# Patient Record
Sex: Male | Born: 1975 | Race: White | Hispanic: No | Marital: Married | Smoking: Former smoker
Health system: Southern US, Academic
[De-identification: ages and names within clinical notes are randomized; demographics above are authoritative.]

## PROBLEM LIST (undated history)

## (undated) DIAGNOSIS — Z9989 Dependence on other enabling machines and devices: Secondary | ICD-10-CM

## (undated) DIAGNOSIS — K610 Anal abscess: Secondary | ICD-10-CM

## (undated) DIAGNOSIS — F429 Obsessive-compulsive disorder, unspecified: Secondary | ICD-10-CM

## (undated) DIAGNOSIS — E785 Hyperlipidemia, unspecified: Secondary | ICD-10-CM

## (undated) DIAGNOSIS — G4730 Sleep apnea, unspecified: Secondary | ICD-10-CM

## (undated) DIAGNOSIS — E119 Type 2 diabetes mellitus without complications: Secondary | ICD-10-CM

## (undated) DIAGNOSIS — S98119A Complete traumatic amputation of unspecified great toe, initial encounter: Secondary | ICD-10-CM

## (undated) DIAGNOSIS — I7090 Unspecified atherosclerosis: Secondary | ICD-10-CM

## (undated) DIAGNOSIS — I10 Essential (primary) hypertension: Secondary | ICD-10-CM

## (undated) DIAGNOSIS — K922 Gastrointestinal hemorrhage, unspecified: Secondary | ICD-10-CM

## (undated) DIAGNOSIS — M92529 Juvenile osteochondrosis of tibia tubercle, unspecified leg: Secondary | ICD-10-CM

## (undated) DIAGNOSIS — N4404 Torsion of appendix epididymis: Secondary | ICD-10-CM

## (undated) HISTORY — PX: HX APPENDECTOMY: SHX54

## (undated) HISTORY — DX: Obsessive-compulsive disorder, unspecified: F42.9

## (undated) HISTORY — PX: SURGERY SCROTAL / TESTICULAR: SUR1316

## (undated) HISTORY — DX: Unspecified atherosclerosis: I70.90

## (undated) HISTORY — DX: Essential (primary) hypertension: I10

## (undated) HISTORY — PX: TOE AMPUTATION: SHX809

## (undated) HISTORY — PX: COLONOSCOPY: SHX174

## (undated) HISTORY — DX: Hyperlipidemia, unspecified: E78.5

## (undated) HISTORY — DX: Type 2 diabetes mellitus without complications: E11.9

## (undated) HISTORY — PX: HIP SURGERY: SHX245

## (undated) HISTORY — PX: CYSTECTOMY: SUR359

---
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
## 2010-10-16 ENCOUNTER — Ambulatory Visit (HOSPITAL_COMMUNITY): Payer: Self-pay

## 2019-12-28 IMAGING — US US ABDOMEN COMPLETE
1 series · 14 of 25 positions shown · non-contrast
Comparison: None available.

EXAM:  US ABDOMEN COMPLETE
INDICATION: 43-year-old male with leukocytosis.  Abdominal pain.  Attention spleen.  Previous appendectomy.

[Series 5: us abdomen complete · 0.37mm/px · 14 of 58 slices shown]
[im 1/58]
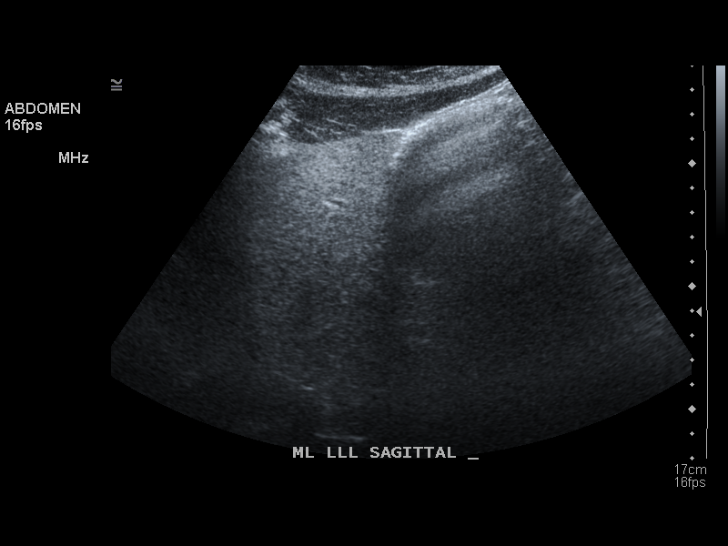
[im 5/58]
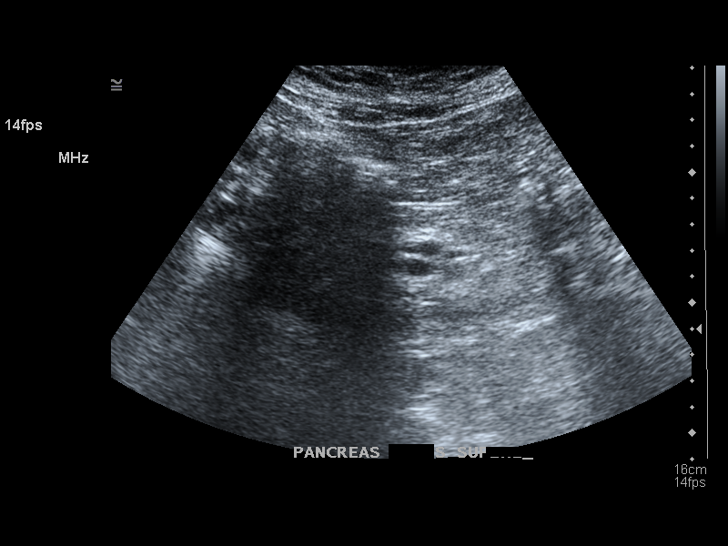
[im 10/58]
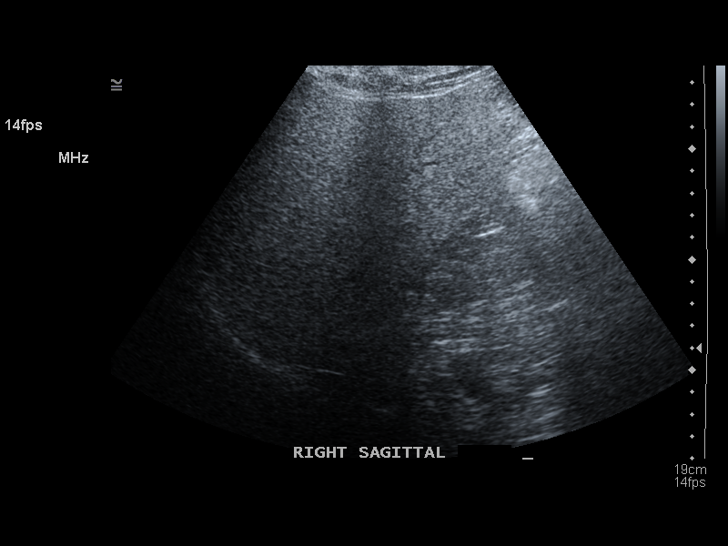
[im 15/58]
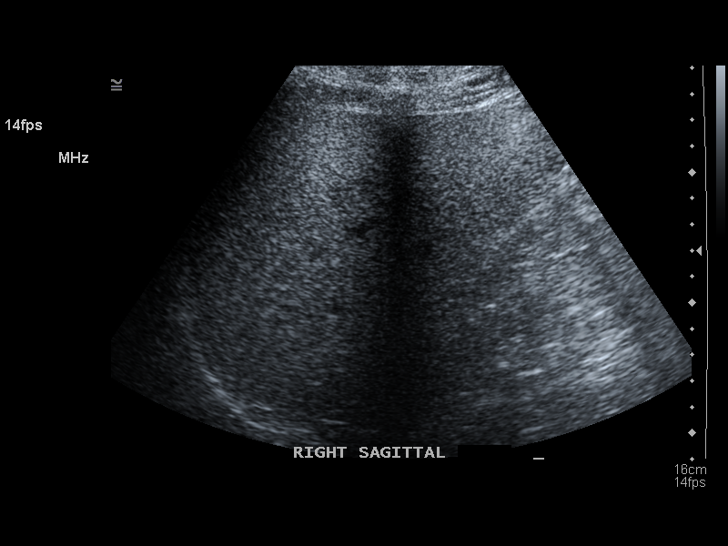
[im 20/58]
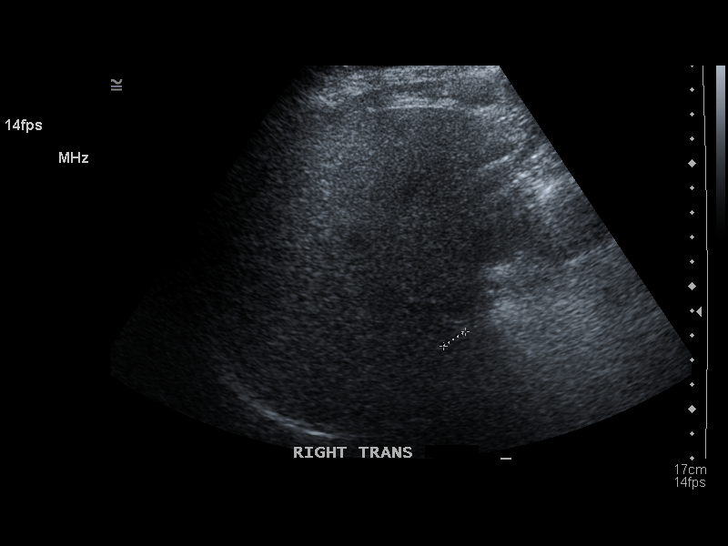
[im 22/58]
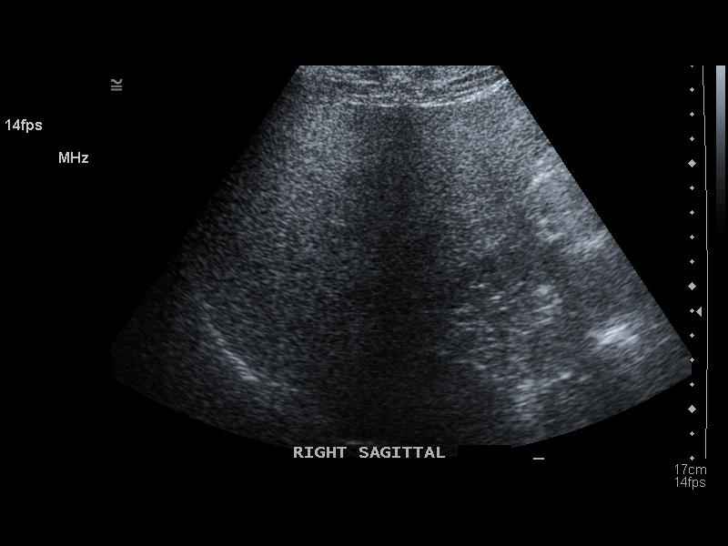
[im 27/58]
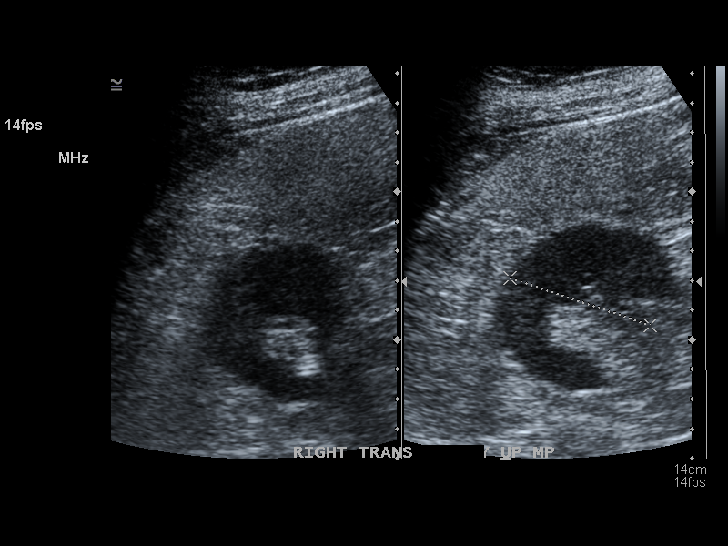
[im 31/58]
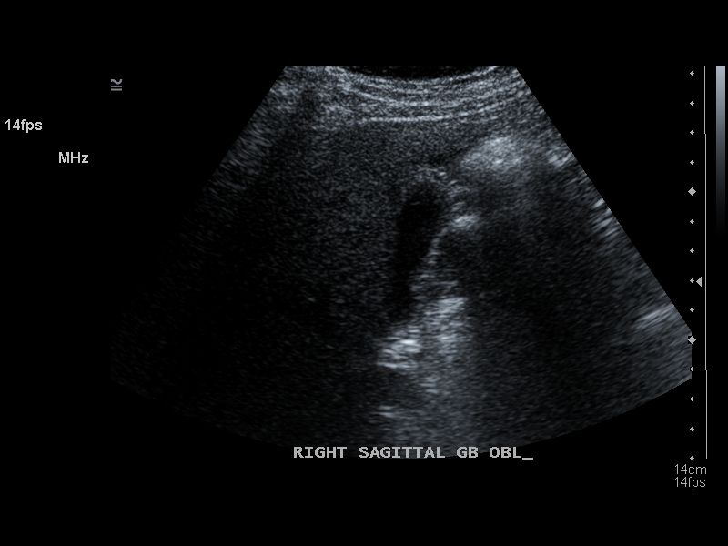
[im 36/58]
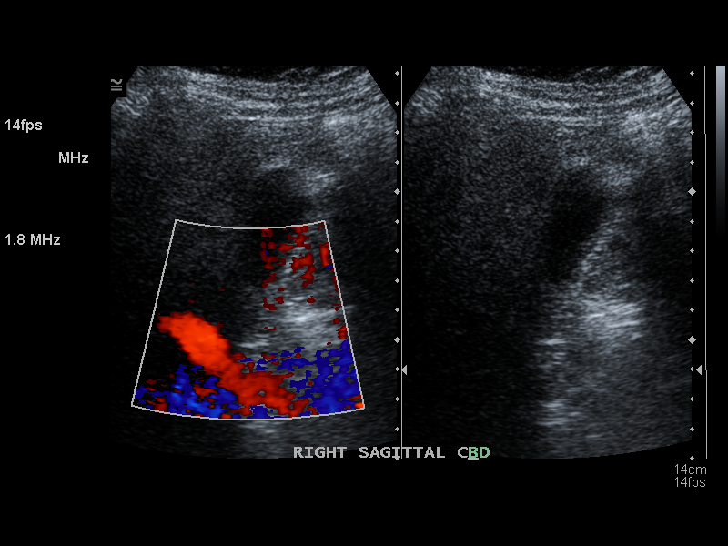
[im 39/58]
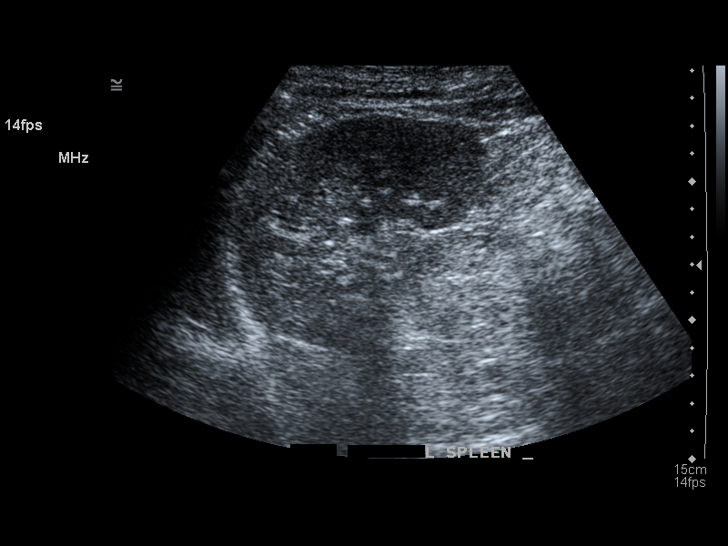
[im 43/58]
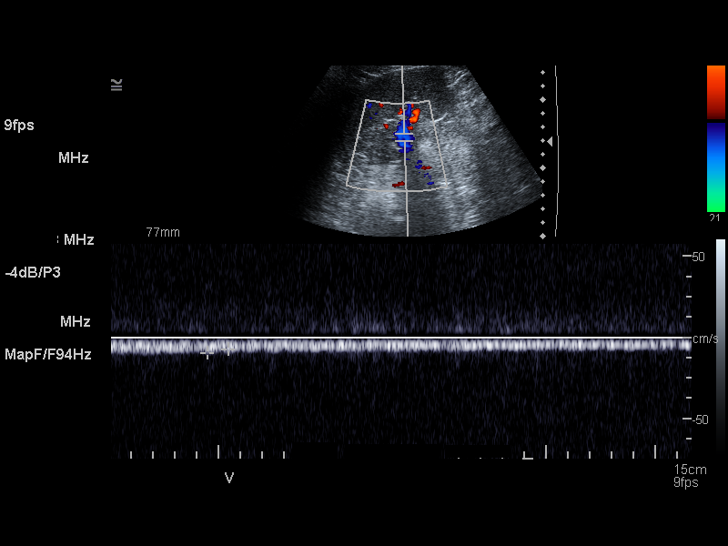
[im 48/58]
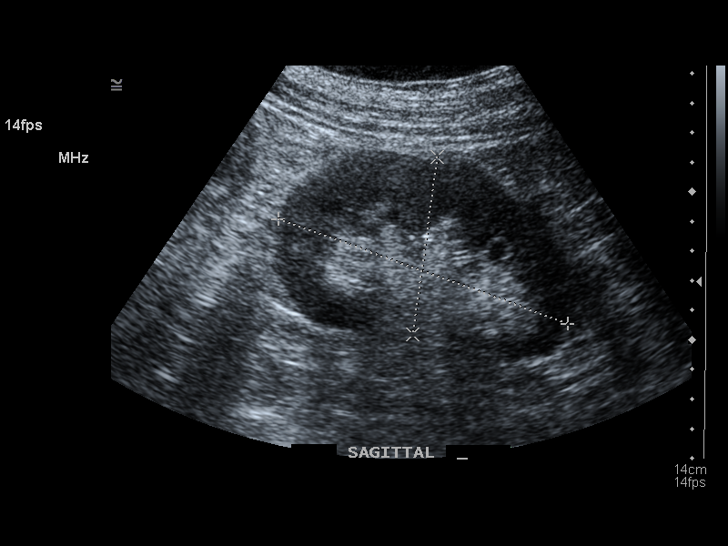
[im 53/58]
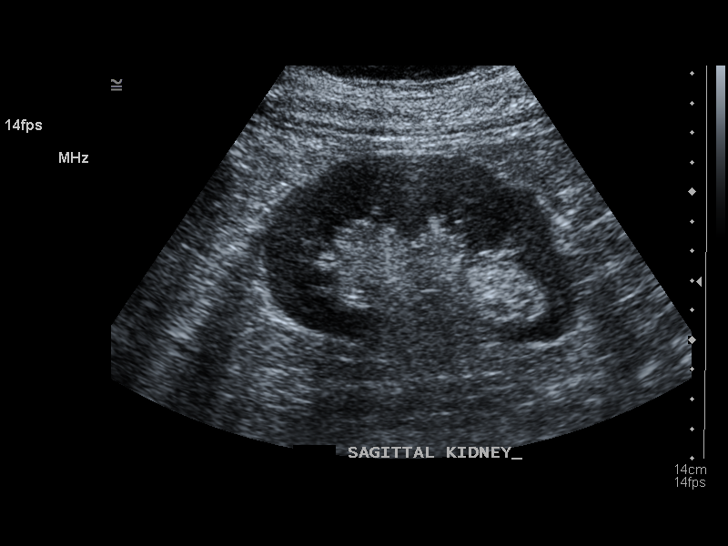
[im 58/58]
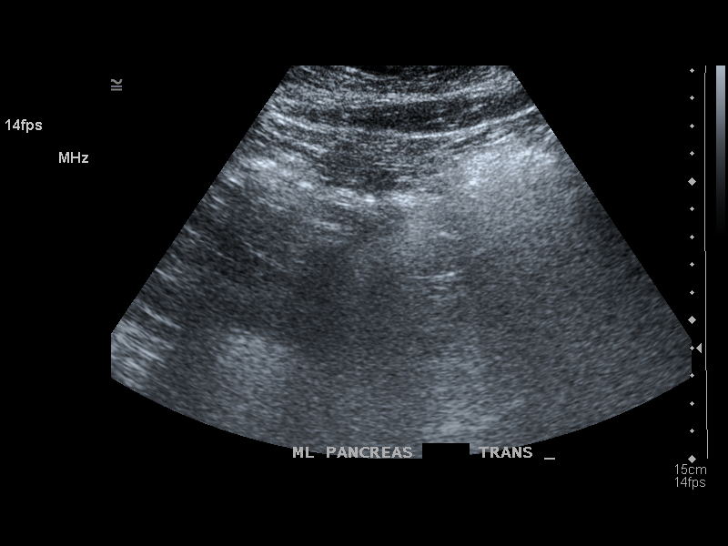

[14 of 25 positions shown; findings below may reference images not displayed]

FINDINGS: Gallbladder shows no stones or edema.  Common hepatic duct measures 3 mm.  

Normal sized liver measuring 15 cm in craniocaudal span with fatty changes of the liver.  Portal vein and hepatic veins are patent.  

Normal sized spleen measuring 10 cm in length.  Multiple granulomatous lesions of the spleen are noted.  

Kidneys do not show any obstructive changes o neither side.  Nonobstructing calculus of middle calyx of right kidney is suspected. 

Aorta and pancreas are poorly visualized.  No free fluid.
IMPRESSION: Normal gallbladder and bile ducts.

Normal sized liver and spleen.  Fatty changes of liver and granulomatous lesions of the spleen are noted.  Portal vein and hepatic veins are patent. 

Small nonobstructing calculus of mid calyx of right kidney.  

No free fluid noted.

## 2020-03-08 IMAGING — CR XRAY TIBIA/FIBULA 2V RT
1 series · 4 of 4 positions shown · non-contrast
Comparison: None available.

﻿EXAM:  XRAY TIBIA/FIBULA 2V RT
INDICATION: Right lower leg pain.

[Series 1: view not recorded · 0.17mm/px · 4 of 4 slices shown]
[im 1/4]
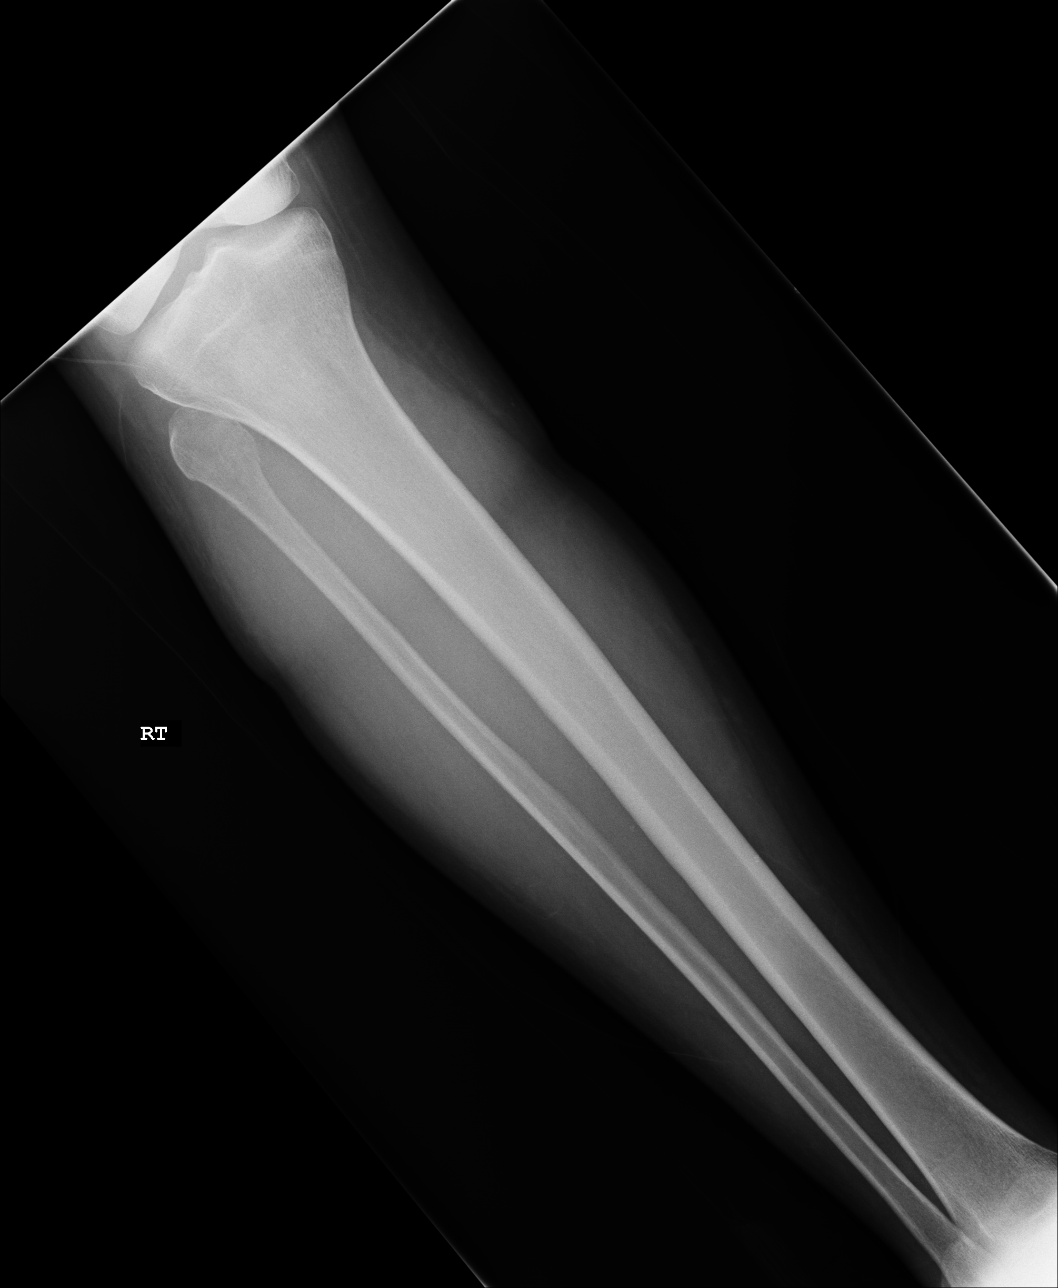
[im 2/4]
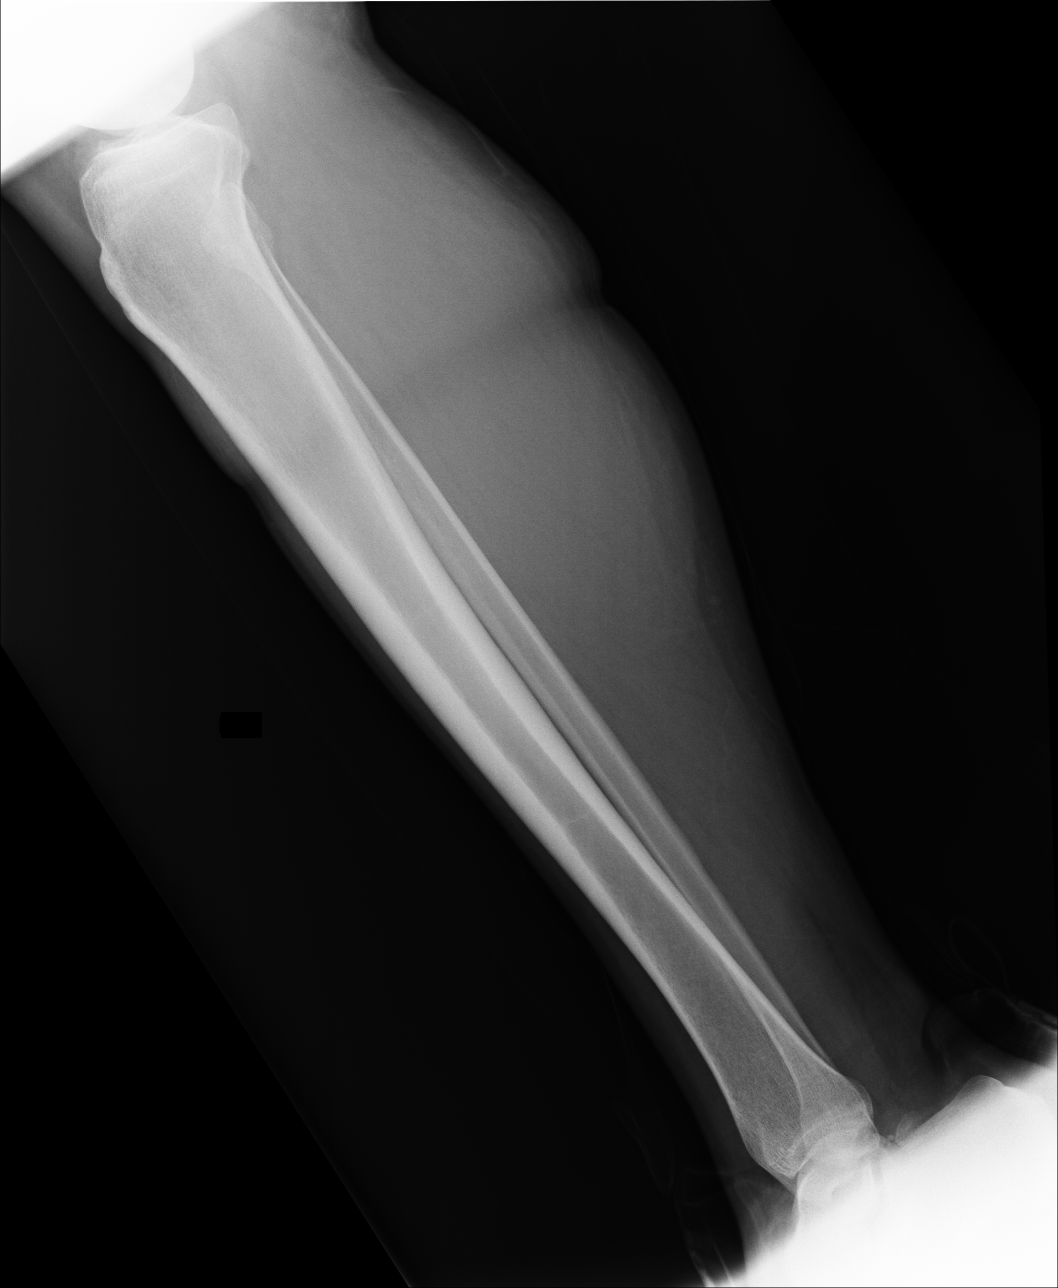
[im 3/4]
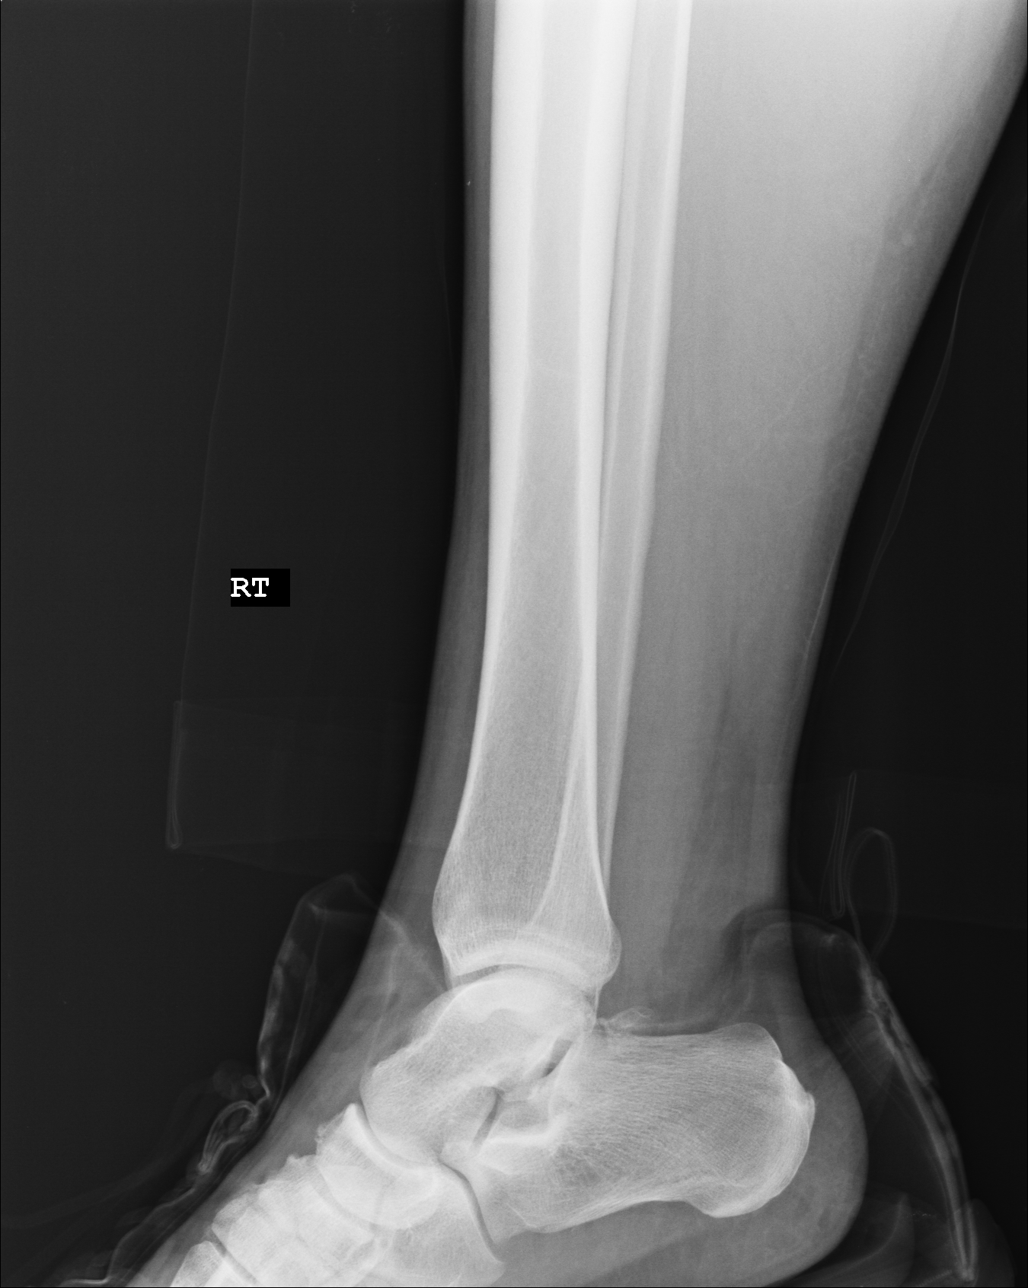
[im 4/4]
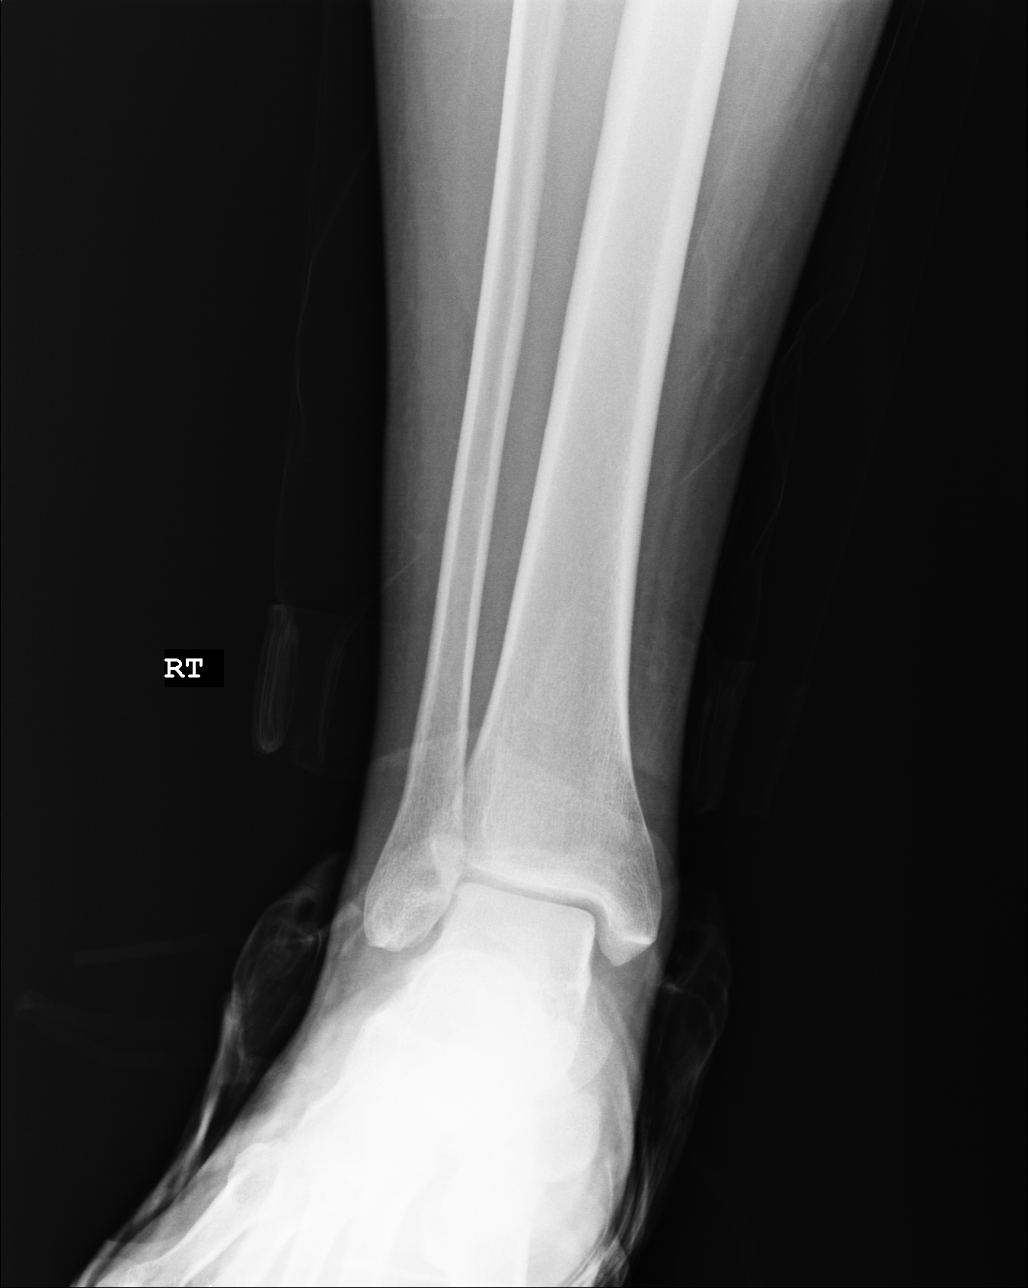

[4 of 4 positions shown; findings below may reference images not displayed]

FINDINGS: There is no acute fracture or subluxation. No suspicious lytic or blastic osseous lesion is seen. Surrounding soft tissues are also unremarkable.
IMPRESSION: Unremarkable exam.

## 2020-03-08 IMAGING — MR MRI JOINT LOWER EXTREMITY WITHOUT CONTRAST RT
5 of 6 series · 27 of 40 positions shown · IV contrast (gadolinium)
Comparison: None available.

﻿EXAM:  MRI JOINT LOWER EXTREMITY WITHOUT CONTRAST RT
INDICATION: Chronic pain.
TECHNIQUE: Multiplanar multisequential MRI of the right knee joint was performed without gadolinium contrast.

[Series 5: PD fat-sat · axial · right · 4.0mm · 0.37mm/px · z∈[-71,+60]mm · 6 of 30 slices shown (1 of 3)]
[im 1/30]
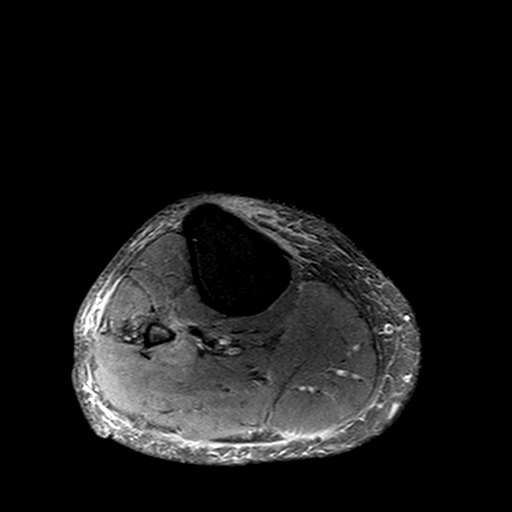
[im 6/30]
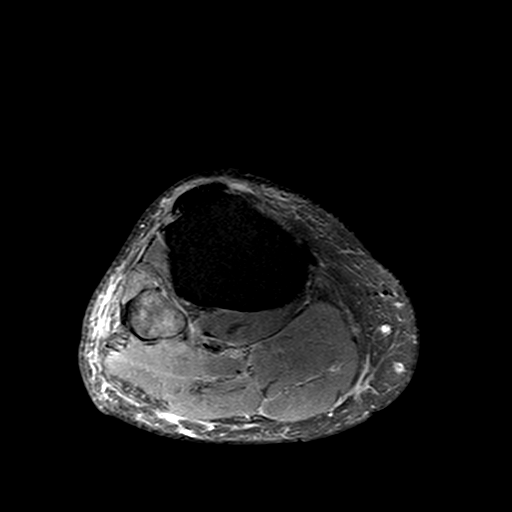
[im 12/30]
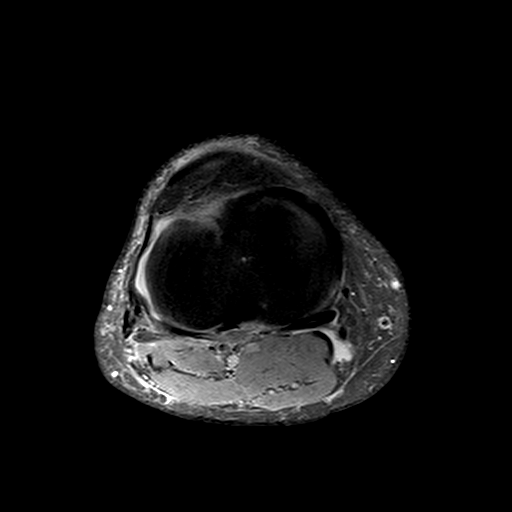
[im 18/30]
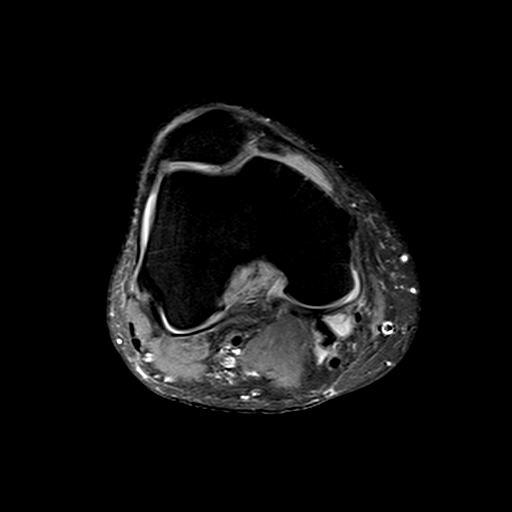
[im 24/30]
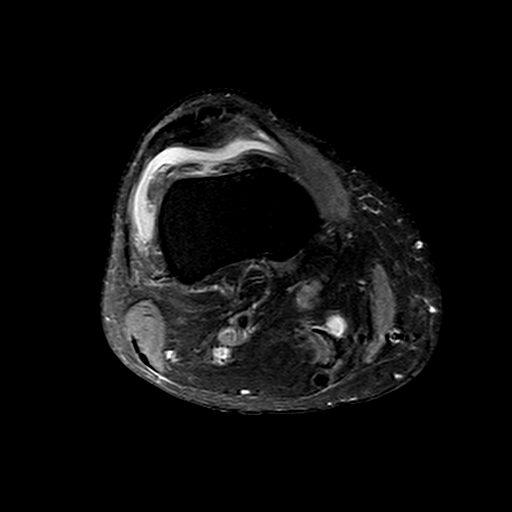
[im 30/30]
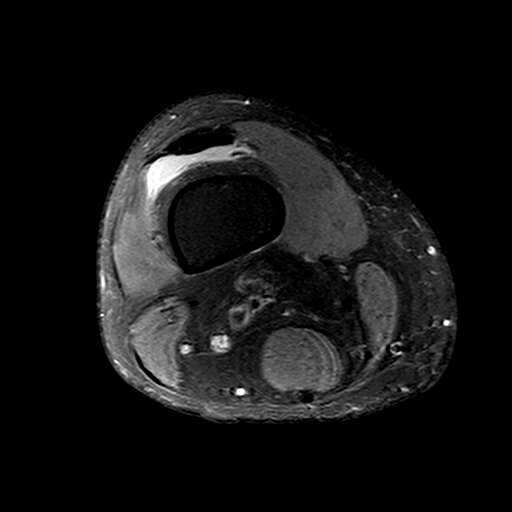

[Series 6: PD fat-sat · sagittal · right · 3.0mm · 0.29mm/px · 6 of 30 slices shown (2 of 3)]
[im 1/30]
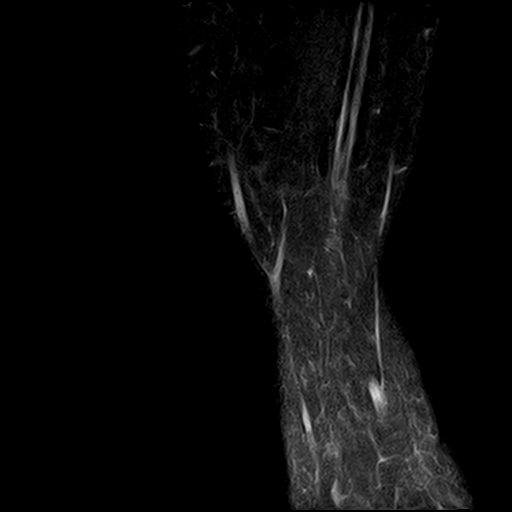
[im 6/30]
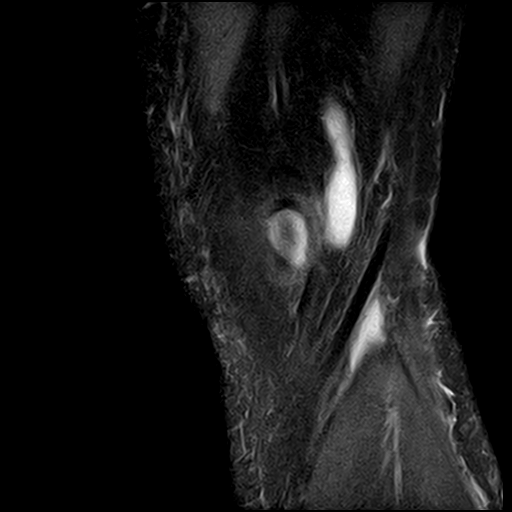
[im 12/30]
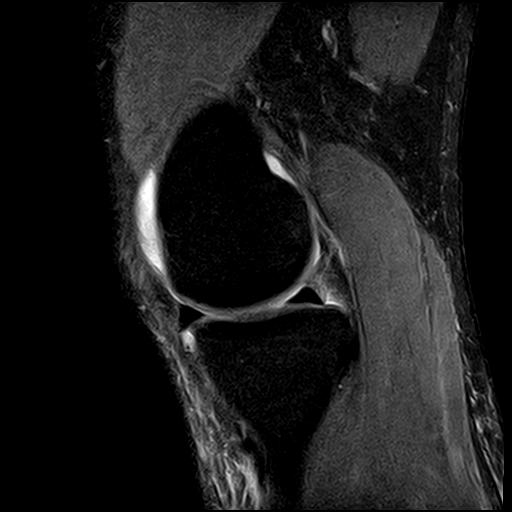
[im 18/30]
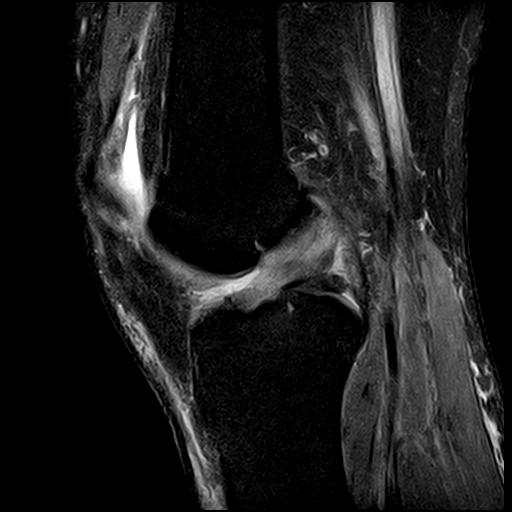
[im 24/30]
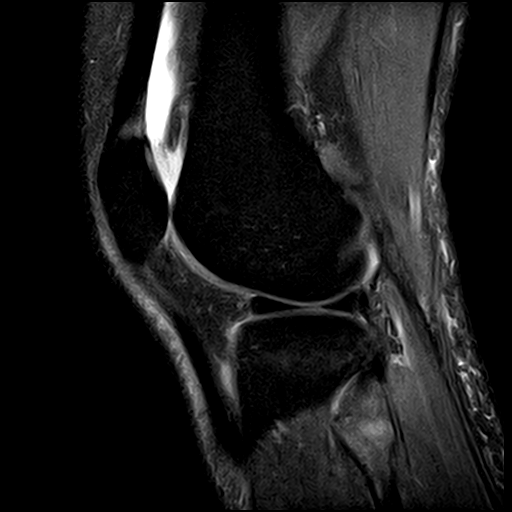
[im 30/30]
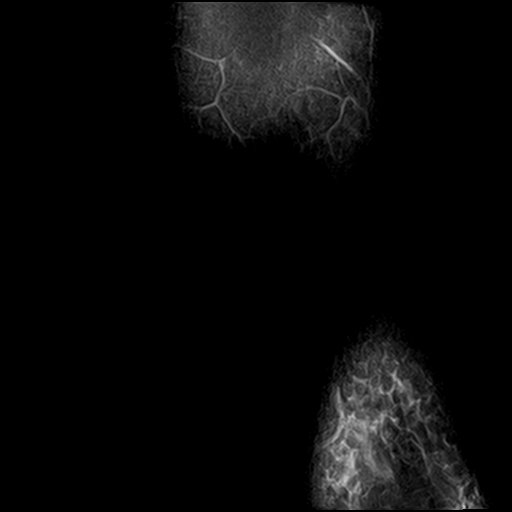

[Series 7: STIR · coronal · right · 3.0mm · 0.47mm/px · 1 of 30 slices shown]
[im 1/30]
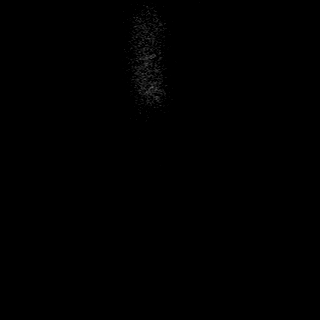

[Series 9: T1 · sagittal · right · 3.0mm · 0.39mm/px · 7 of 30 slices shown]
[im 1/30]
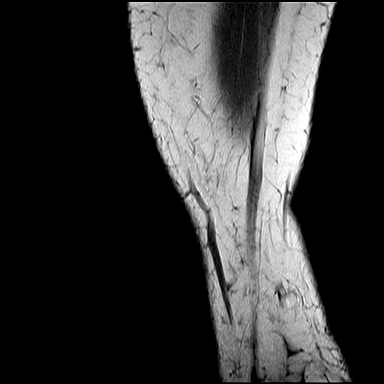
[im 5/30]
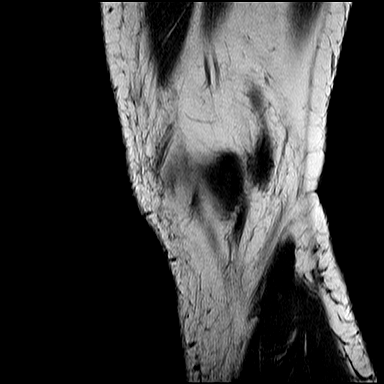
[im 10/30]
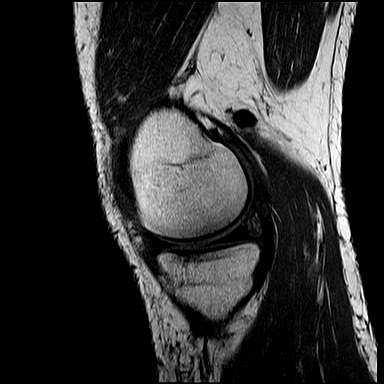
[im 15/30]
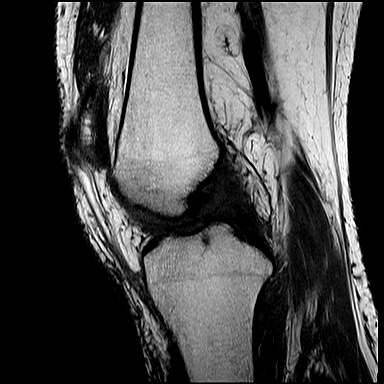
[im 20/30]
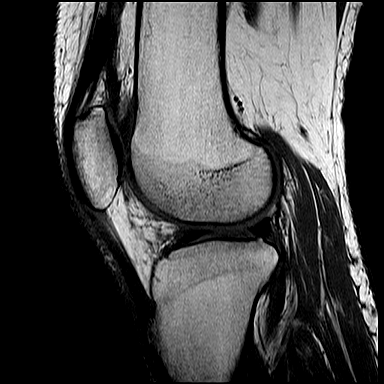
[im 25/30]
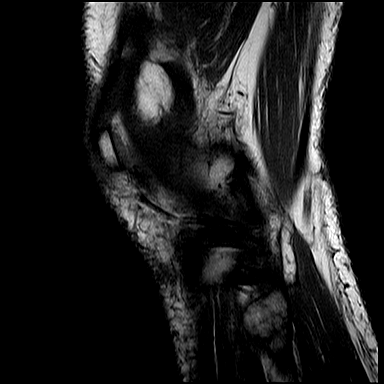
[im 30/30]
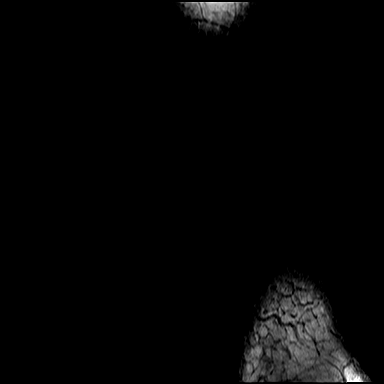

[Series 10: PD fat-sat · coronal · right · 3.0mm · 0.47mm/px · 7 of 30 slices shown (3 of 3)]
[im 1/30]
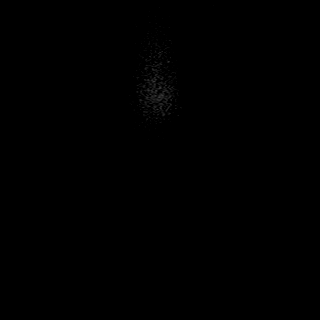
[im 5/30]
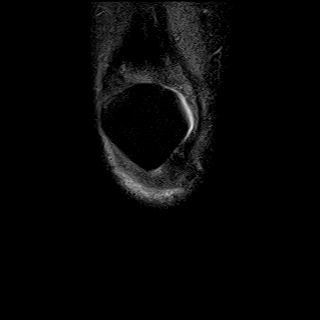
[im 10/30]
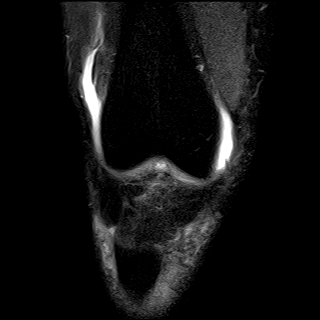
[im 15/30]
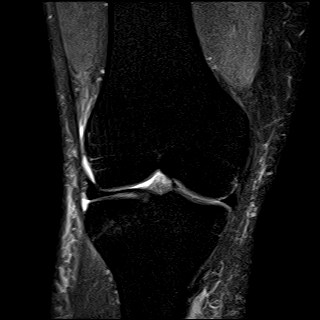
[im 20/30]
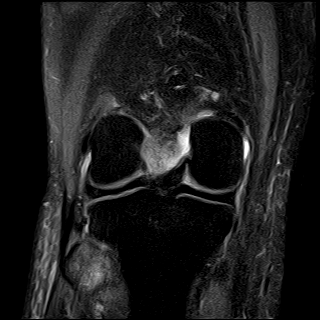
[im 25/30]
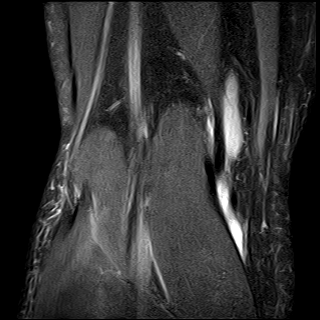
[im 30/30]
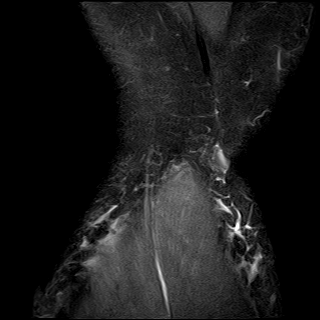

[27 of 40 positions shown; findings below may reference images not displayed]

FINDINGS: There is abnormal thickening and T2 signal hyperintensity of the anterior cruciate ligament. Menisci, posterior cruciate and collateral ligaments are intact, within normal limits in morphology and signal intensity. Hyaline cartilage of the tibiofemoral and patellofemoral articulations is well maintained. Extensor mechanism is intact. Capsular attachments are well maintained. There is a nondisplaced fibular head fracture. There is a small suprapatellar effusion. There is also a 4.5 cm Baker's cyst.
IMPRESSION: 1. Abnormal thickening and T2 signal hyperintensity of the anterior cruciate ligament suggestive of mucoid degeneration. 

2. Nondisplaced fibular head fracture. 

3. Small suprapatellar effusion and a 4.5 cm Baker's cyst.

## 2021-06-09 IMAGING — MR MRI KNEE RT W/O CONTRAST
4 of 5 series · 32 of 40 positions shown · IV contrast (gadolinium)
Comparison: MRI dated 03/08/2020.

﻿EXAM:  02039   MRI KNEE RT W/O CONTRAST
INDICATION: Persistent pain.
TECHNIQUE: Multiplanar multisequential MRI of the right knee joint was performed without gadolinium contrast.

[Series 5: PD fat-sat · axial · left · 4.0mm · 0.37mm/px · z∈[-47,+83]mm · 8 of 30 slices shown (1 of 3)]
[im 1/30]
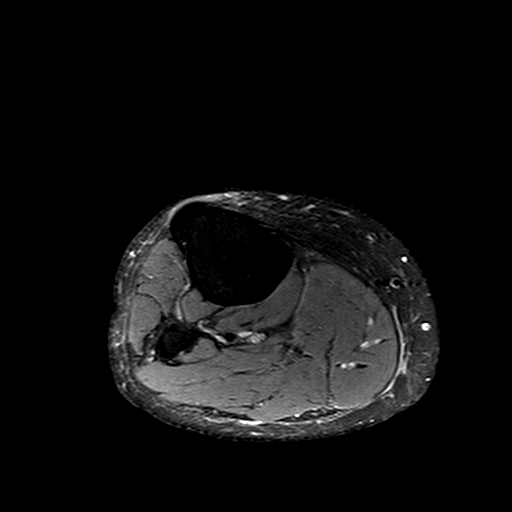
[im 5/30]
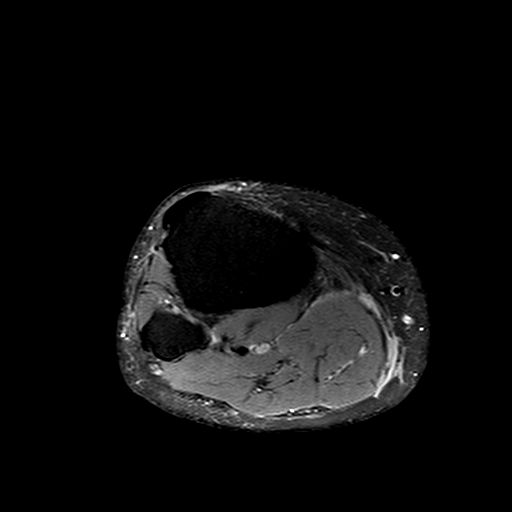
[im 9/30]
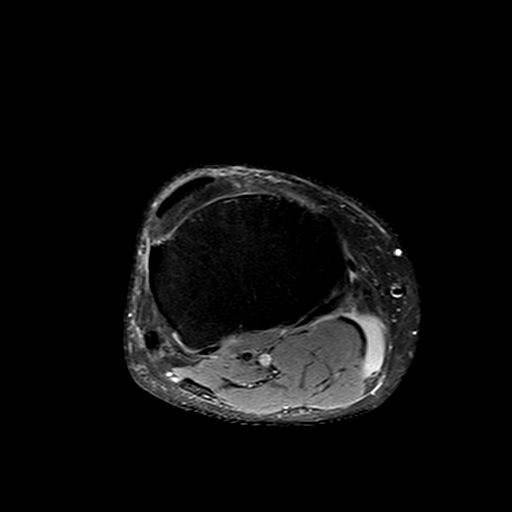
[im 13/30]
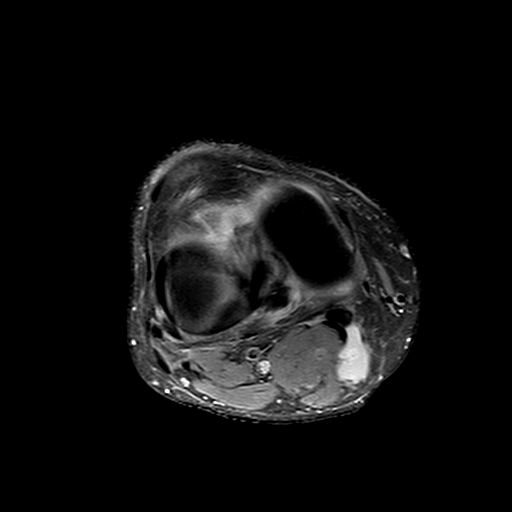
[im 17/30]
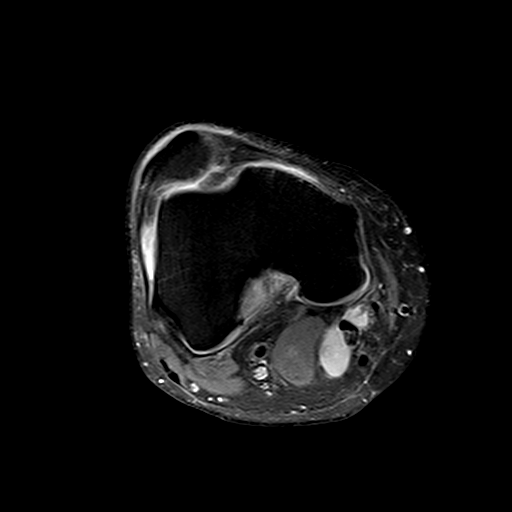
[im 21/30]
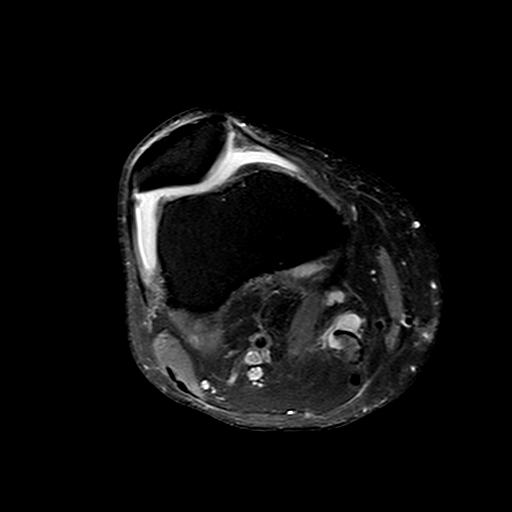
[im 25/30]
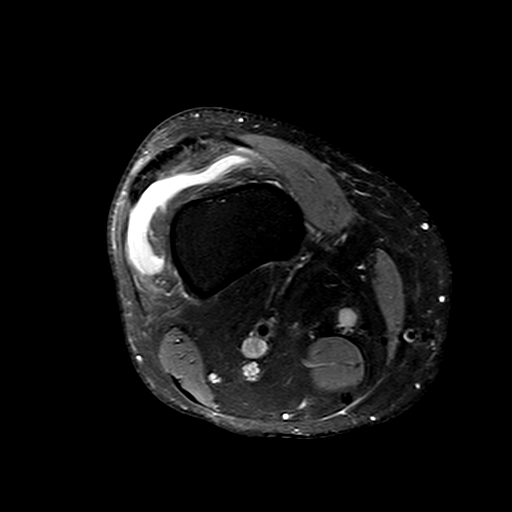
[im 30/30]
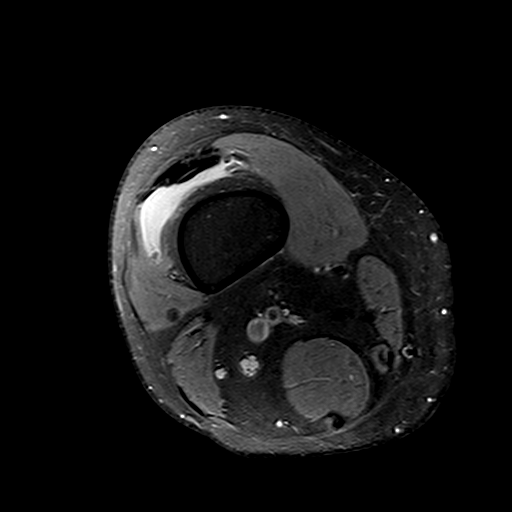

[Series 6: PD fat-sat · sagittal · left · 3.0mm · 0.47mm/px · 9 of 30 slices shown (2 of 3)]
[im 1/30]
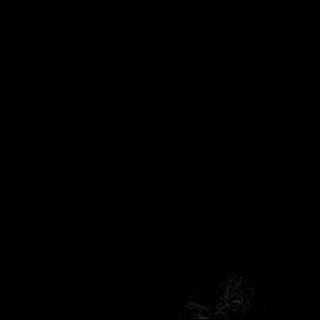
[im 4/30]
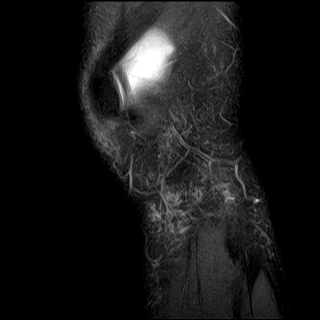
[im 8/30]
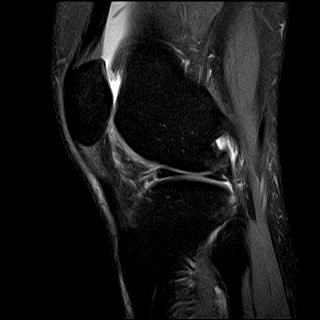
[im 11/30]
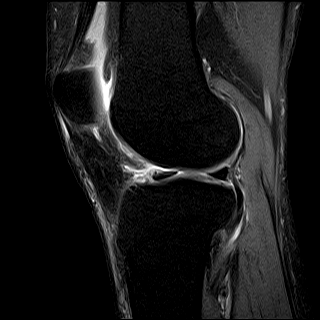
[im 15/30]
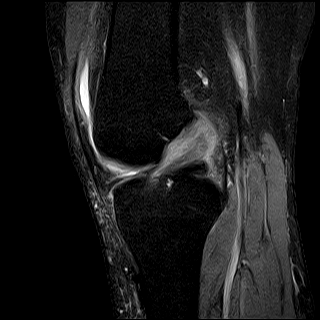
[im 19/30]
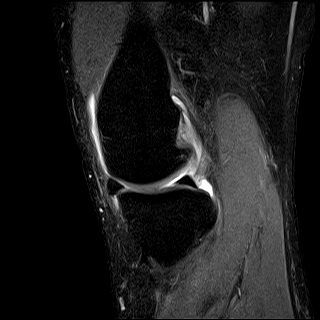
[im 22/30]
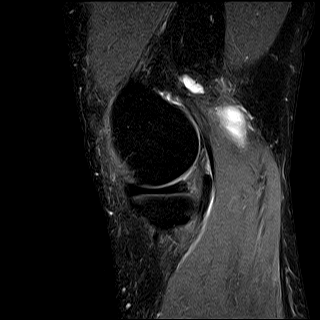
[im 26/30]
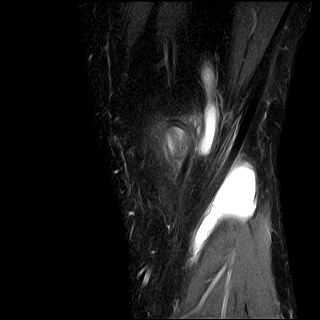
[im 30/30]
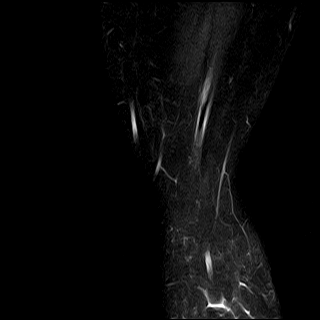

[Series 7: T1 · sagittal · left · 3.0mm · 0.39mm/px · 8 of 30 slices shown]
[im 1/30]
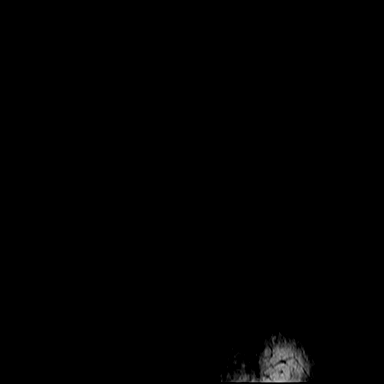
[im 4/30]
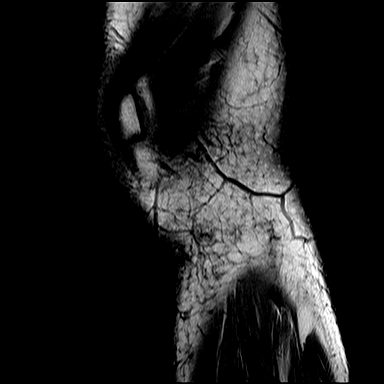
[im 8/30]
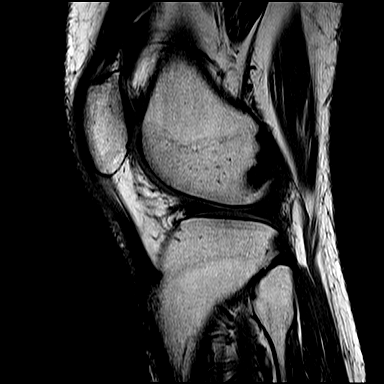
[im 11/30]
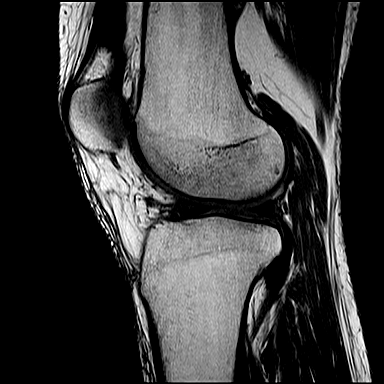
[im 15/30]
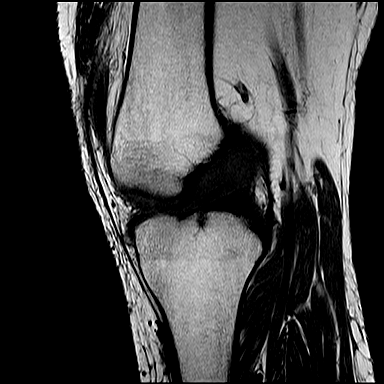
[im 19/30]
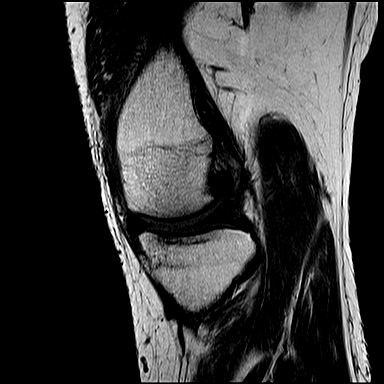
[im 22/30]
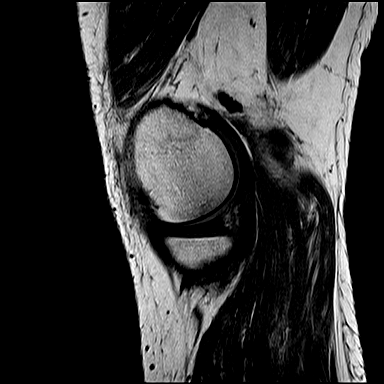
[im 26/30]
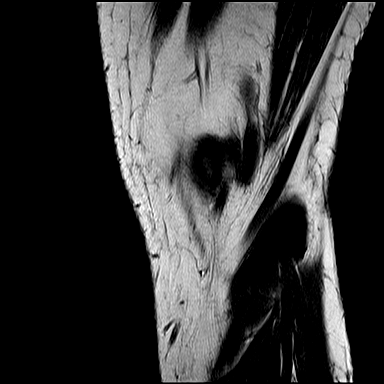

[Series 9: PD fat-sat · coronal · left · 3.5mm · 0.47mm/px · 7 of 22 slices shown (3 of 3)]
[im 1/22]
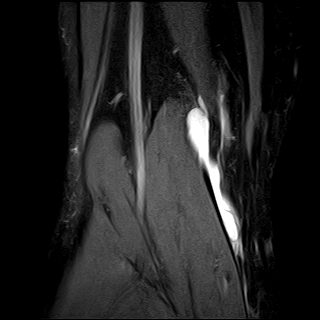
[im 4/22]
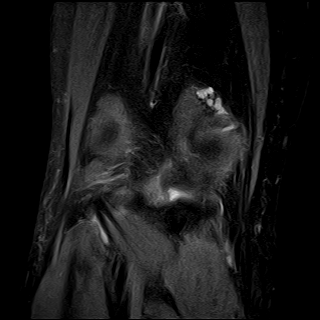
[im 8/22]
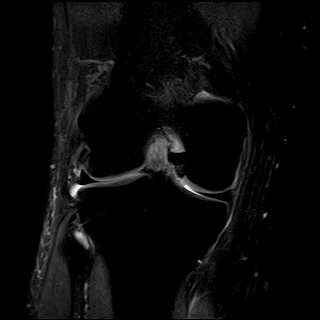
[im 11/22]
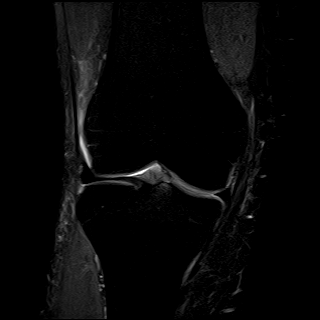
[im 15/22]
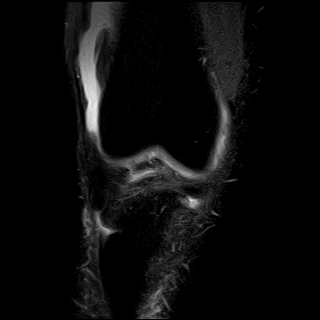
[im 18/22]
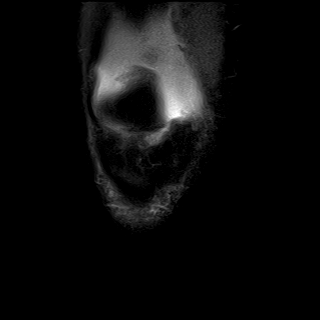
[im 22/22]
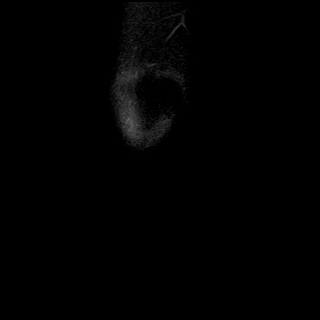

[32 of 40 positions shown; findings below may reference images not displayed]

FINDINGS: Mucoid degeneration of the anterior cruciate ligament is again seen. The ligament is grossly intact. Menisci, posterior cruciate and collateral ligaments are normal in morphology and signal intensity. Hyaline cartilage of the tibiofemoral and patellofemoral articulations is also well maintained. Extensor mechanism is unremarkable. Bone marrow signal intensity is normal. There is a small suprapatellar effusion. There is a 6 cm Baker's cyst.
IMPRESSION: 1. Stable mucoid degeneration of the anterior cruciate ligament. 

2. Intact menisci, posterior cruciate and collateral ligaments. 

3. Small suprapatellar effusion and 6 cm Baker's cyst.

## 2022-08-08 ENCOUNTER — Encounter (INDEPENDENT_AMBULATORY_CARE_PROVIDER_SITE_OTHER): Payer: Self-pay | Admitting: Surgery

## 2022-08-08 ENCOUNTER — Other Ambulatory Visit: Payer: Self-pay

## 2022-08-08 ENCOUNTER — Ambulatory Visit (INDEPENDENT_AMBULATORY_CARE_PROVIDER_SITE_OTHER): Payer: BC Managed Care – PPO | Admitting: Surgery

## 2022-08-08 VITALS — BP 125/82 | HR 92 | Temp 98.0°F | Ht 70.0 in | Wt 238.0 lb

## 2022-08-08 DIAGNOSIS — Z1211 Encounter for screening for malignant neoplasm of colon: Secondary | ICD-10-CM

## 2022-08-08 DIAGNOSIS — E119 Type 2 diabetes mellitus without complications: Secondary | ICD-10-CM

## 2022-08-08 DIAGNOSIS — Z9889 Other specified postprocedural states: Secondary | ICD-10-CM

## 2022-08-08 MED ORDER — SUFLAVE 178.7 GRAM-7.3 GRAM-0.5 GRAM ORAL SOLUTION
ORAL | 0 refills | Status: DC
Start: 2022-08-08 — End: 2022-09-18

## 2022-08-09 ENCOUNTER — Encounter (INDEPENDENT_AMBULATORY_CARE_PROVIDER_SITE_OTHER): Payer: Self-pay | Admitting: Surgery

## 2022-08-09 NOTE — H&P (Signed)
GENERAL SURGERY, [address]  [place] [address]       Name: Justin [name]. MRN:  [id number]   Date: 08/08/2022 Age: 47 y.o. 08-Sep-1975      PCP: Cam Hai, FNP     Subjective  Justin [name]. is a 47 y.o. year old male who presents for colonoscopy.  No current GI complaints.  No constipation or diarrhea.  No abdominal pain.  No rectal bleeding.  No unexplained weight loss.  No change in bowel habits.  No family history of colon cancer.  Prior history of exploratory laparotomy in his 60s secondary to bleeding with false-positive Meckel scan .  Etiology of his melena was not ever identified.  Currently using Mounjaro      Last colonoscopy:  20 years ago  History of polyps :  None prior    I have reviewed the patient's provided medical records and diagnostic testing including laboratory values, imaging results, documented encounters and providers notes with all pertinent information noted with respect to today's evaluation serving as unique tests and sources as a component of the medical decision making process for this encounter relevant to the patients independent evaluation by me today.        Patient Data  Patient History  Past Medical History:   Diagnosis Date    Diabetes mellitus, type 2 (CMS HCC)     HLD (hyperlipidemia)     Hypertension     OCD (obsessive compulsive disorder)          Past Surgical History:   Procedure Laterality Date    CYSTECTOMY           Current Outpatient Medications   Medication Sig    atorvastatin (LIPITOR) 20 mg Oral Tablet Take 1 Tablet (20 mg total) by mouth Once a day    HUMALOG KWIKPEN INSULIN 100 unit/mL Subcutaneous Insulin Pen ADMINISTER UP TO 20 UNITS UNDER THE SKIN THREE TIMES DAILY BEFORE MEALS    IBU 800 mg Oral Tablet     lisinopriL (PRINIVIL) 10 mg Oral Tablet Take 1 Tablet (10 mg total) by mouth Once a day    metFORMIN (GLUCOPHAGE XR) 500 mg Oral Tablet Sustained Release 24 hr     metoprolol succinate  (TOPROL-XL) 100 mg Oral Tablet Sustained Release 24 hr     montelukast (SINGULAIR) 10 mg Oral Tablet     peg 3350-sod sulf,chlr-pot-mag (SUFLAVE) 178.7-7.3-0.5 gram Oral Recon Soln Start drinking the first bottle at 10-10:30am then the second bottle from 6-6:30pm.    tirzepatide (MOUNJARO) 12.5 mg/0.5 mL Subcutaneous Pen Injector     TOUJEO MAX U-300 SOLOSTAR 300 unit/mL (3 mL) Subcutaneous Insulin Pen 100 units sc daily     No Known Allergies  Family Medical History:    None         Social History     Tobacco Use    Smoking status: Former     Types: Cigarettes    Smokeless tobacco: Never   Substance Use Topics    Alcohol use: Not Currently         The above documented section regarding past medical, past surgical, family, and social history ([place]) has been reviewed and considered and to the best of my knowledge represents a valid and accurate reflection of the patient's previous pertinent experiences documented by multiple providers and participants of the EMR.I cannot attest to all entries but do no recognize any gross inaccuracies as the  data is a common field across all providers  Further history pertinent to the current encounter will be found as referenced     Objective:       Vitals:    08/08/22 0920   BP: 125/82   Pulse: 92   Temp: 36.7 C (98 F)   SpO2: 96%   Weight: 108 kg (238 lb)   Height: 1.778 m ('5\' 10"'$ )   BMI: 34.22        General: appropriate for age. in no acute distress.    Vital signs are present above and have been reviewed by me     HEENT: Atraumatic, Normocephalic.    Lungs: Nonlabored breathing with symmetric expansion    Heart:Regular wth respect to rate and rythmn.    Abdomen:Soft. Nontender. Nondistended     Psychiatric: Alert and oriented to person, place, and time. affect appropriate    Assessment/Plan    ICD-10-CM    1. Encounter for screening colonoscopy for non-high-risk patient  Z12.11       2. History of exploratory laparotomy  Z98.890       3. Type 2 diabetes mellitus (CMS HCC)   E11.9            Discussed indications, risks, and benefits of colonoscopy with the patient.  Discussed the possibility of polypectomy, biopsies, and repeat possible examinations.  Risks discussed include bleeding, sedation risks, possibility of missed diagnosis of polyp malignancy, and remote possibility of perforation and/or death.  All questions answered and informed consent clearly obtained.      The office visit was used for detailed explanation procedure and its indications, review of the patient's medications relative to the time before and after the procedure, and the effects of the associated medical conditions that affect the procedure preparation and procedure itself. Preoperative testing where indicated (i.e. labs, x-ray, or cardiovascular noninvasive testing) was ordered as guided by the patient's clinical history, comorbidities, and physical exam findings to optimize perioperative and postoperative care.          Reinaldo Meeker MD FACS RVT  [facility]

## 2022-09-18 ENCOUNTER — Encounter (HOSPITAL_COMMUNITY): Payer: BC Managed Care – PPO | Admitting: Surgery

## 2022-09-18 ENCOUNTER — Other Ambulatory Visit: Payer: Self-pay

## 2022-09-18 ENCOUNTER — Encounter (HOSPITAL_COMMUNITY)
Admission: RE | Disposition: A | Discharge status: Home / Self Care | Payer: Self-pay | Source: Ambulatory Visit | Attending: Surgery

## 2022-09-18 ENCOUNTER — Ambulatory Visit (HOSPITAL_COMMUNITY): Payer: BC Managed Care – PPO | Admitting: Certified Registered"

## 2022-09-18 ENCOUNTER — Inpatient Hospital Stay
Admission: RE | Admit: 2022-09-18 | Discharge: 2022-09-18 | Disposition: A | Discharge status: Home / Self Care | Payer: BC Managed Care – PPO | Source: Ambulatory Visit | Attending: Surgery | Admitting: Surgery

## 2022-09-18 ENCOUNTER — Encounter (HOSPITAL_COMMUNITY): Payer: Self-pay | Admitting: Surgery

## 2022-09-18 DIAGNOSIS — Z87891 Personal history of nicotine dependence: Secondary | ICD-10-CM | POA: Insufficient documentation

## 2022-09-18 DIAGNOSIS — E119 Type 2 diabetes mellitus without complications: Secondary | ICD-10-CM | POA: Insufficient documentation

## 2022-09-18 DIAGNOSIS — K648 Other hemorrhoids: Secondary | ICD-10-CM

## 2022-09-18 DIAGNOSIS — Z794 Long term (current) use of insulin: Secondary | ICD-10-CM | POA: Insufficient documentation

## 2022-09-18 DIAGNOSIS — I10 Essential (primary) hypertension: Secondary | ICD-10-CM | POA: Insufficient documentation

## 2022-09-18 DIAGNOSIS — Z7984 Long term (current) use of oral hypoglycemic drugs: Secondary | ICD-10-CM | POA: Insufficient documentation

## 2022-09-18 DIAGNOSIS — Z6834 Body mass index (BMI) 34.0-34.9, adult: Secondary | ICD-10-CM | POA: Insufficient documentation

## 2022-09-18 DIAGNOSIS — Z1211 Encounter for screening for malignant neoplasm of colon: Secondary | ICD-10-CM | POA: Insufficient documentation

## 2022-09-18 DIAGNOSIS — Z79899 Other long term (current) drug therapy: Secondary | ICD-10-CM | POA: Insufficient documentation

## 2022-09-18 DIAGNOSIS — E669 Obesity, unspecified: Secondary | ICD-10-CM | POA: Insufficient documentation

## 2022-09-18 DIAGNOSIS — E785 Hyperlipidemia, unspecified: Secondary | ICD-10-CM | POA: Insufficient documentation

## 2022-09-18 DIAGNOSIS — Z7985 Long-term (current) use of injectable non-insulin antidiabetic drugs: Secondary | ICD-10-CM | POA: Insufficient documentation

## 2022-09-18 DIAGNOSIS — F429 Obsessive-compulsive disorder, unspecified: Secondary | ICD-10-CM | POA: Insufficient documentation

## 2022-09-18 SURGERY — COLONOSCOPY
Anesthesia: General | Wound class: Clean Contaminated Wounds-The respiratory, GI, Genital, or urinary

## 2022-09-18 MED ORDER — PROPOFOL 10 MG/ML INTRAVENOUS EMULSION
Freq: Once | INTRAVENOUS | Status: DC | PRN
Start: 2022-09-18 — End: 2022-09-18
  Administered 2022-09-18: 40 mL via INTRAVENOUS

## 2022-09-18 MED ORDER — DEXTROSE 5 % AND LACTATED RINGERS INTRAVENOUS SOLUTION
INTRAVENOUS | Status: DC | PRN
Start: 2022-09-18 — End: 2022-09-18

## 2022-09-18 NOTE — Discharge Instructions (Addendum)
SURGICAL DISCHARGE INSTRUCTIONS     Dr. Abbe Amsterdam, Ladona Horns, MD  performed your COLONOSCOPY today at the [facility]  Finding include; Hemorrhoids     [facility]:  Monday through Friday from 8 a.m. - 4 p.m.: [phone]    For T&D: [phone]  Between 4 p.m. - 8 a.m., weekends and holidays:  Call ER [phone]    PLEASE SEE WRITTEN HANDOUTS AS DISCUSSED BY YOUR NURSE:  Dot Lanes, RN    SIGNS AND SYMPTOMS OF A WOUND / INCISION INFECTION   Be sure to watch for the following:  Increase in redness or red streaks near or around the wound or incision.  Increase in pain that is intense or severe and cannot be relieved by the pain medication that your doctor has given you.  Increase in swelling that cannot be relieved by elevation of a body part, or by applying ice, if permitted.  Increase in drainage, or if yellow / green in color and smells bad. This could be on a dressing or a cast.  Increase in fever for longer than 24 hours, or an increase that is higher than 101 degrees Fahrenheit (normal body temperature is 98 degrees Fahrenheit). The incision may feel warm to the touch.    **CALL YOUR DOCTOR IF ONE OR MORE OF THESE SIGNS / SYMPTOMS SHOULD OCCUR.    ANESTHESIA INFORMATION   ANESTHESIA -- ADULT PATIENTS:  You have received intravenous sedation / general anesthesia, and you may feel drowsy and light-headed for several hours. You may even experience some forgetfulness of the procedure. DO NOT DRIVE A MOTOR VEHICLE or perform any activity requiring complete alertness or coordination until you feel fully awake in about 24-48 hours. Do not drink alcoholic beverages for at least 24 hours. Do not stay alone, you must have a responsible adult available to be with you. You may also experience a dry mouth or nausea for 24 hours. This is a normal side effect and will disappear as the effects of the medication wear off.    REMEMBER   If you experience any difficulty breathing, chest pain,  bleeding that you feel is excessive, persistent nausea or vomiting or for any other concerns:  Call your physician Dr.  Abbe Amsterdam, Ladona Horns, MD   at [phone] . You may also ask to have the general doctor on call paged. They are available to you 24 hours a day.      SPECIAL INSTRUCTIONS / COMMENTS   No driving, cooking, cleaning or operating machinery today, you may resume your normal activities tomorrow. You may eat a regular diet as tolerated today and take medications as normal.     FOLLOW-UP APPOINTMENTS   Follow-up as needed with Dr. Abbe Amsterdam.

## 2022-09-18 NOTE — H&P (Signed)
General Surgery  History and Physical    Date of Service:    09/18/2022  Date of Admission:  09/18/2022  Date of Birth:  02/19/1976  PCP: Lilia Pro, FNP    Reason for admission:  Screening colonoscopy    HPI:  Justin [name]. is a 47 y.o. White male presenting for screening colonoscopy  Past Medical History:   Diagnosis Date    Diabetes mellitus, type 2 (CMS HCC)     HLD (hyperlipidemia)     Hypertension     OCD (obsessive compulsive disorder)       Past Surgical History:   Procedure Laterality Date    CYSTECTOMY        Social History     Tobacco Use    Smoking status: Former     Types: Cigarettes    Smokeless tobacco: Never   Substance Use Topics    Alcohol use: Not Currently       Family Medical History:    None        Medications Prior to Admission       Prescriptions    atorvastatin (LIPITOR) 20 mg Oral Tablet    Take 1 Tablet (20 mg total) by mouth Once a day    HUMALOG KWIKPEN INSULIN 100 unit/mL Subcutaneous Insulin Pen    ADMINISTER UP TO 20 UNITS UNDER THE SKIN THREE TIMES DAILY BEFORE MEALS    IBU 800 mg Oral Tablet    lisinopriL (PRINIVIL) 10 mg Oral Tablet    Take 1 Tablet (10 mg total) by mouth Once a day    metFORMIN (GLUCOPHAGE XR) 500 mg Oral Tablet Sustained Release 24 hr    metoprolol succinate (TOPROL-XL) 100 mg Oral Tablet Sustained Release 24 hr    montelukast (SINGULAIR) 10 mg Oral Tablet    peg 3350-sod sulf,chlr-pot-mag (SUFLAVE) 178.7-7.3-0.5 gram Oral Recon Soln    Start drinking the first bottle at 10-10:30am then the second bottle from 6-6:30pm.    tirzepatide (MOUNJARO) 12.5 mg/0.5 mL Subcutaneous Pen Injector    TOUJEO MAX U-300 SOLOSTAR 300 unit/mL (3 mL) Subcutaneous Insulin Pen    100 units sc daily           @ALLERGY    The above documented section regarding past medical, past surgical, family, and social history ([facility]) has been reviewed and considered and to the best of my knowledge represents a valid and accurate reflection of the patient's previous pertinent experiences  documented by multiple providers and participants of the EMR.I cannot attest to all entries but do no recognize any gross inaccuracies as the data is a common field across all providers  Further history pertinent to the current encounter will be found as referenced    Physical Exam:    No data found.       General: appropriate for age. in no acute distress.    HEENT: Atraumatic, Normocephalic.    Lungs: Nonlabored breathing with symmetric expansion    Heart:Regular wth respect to rate and rythmn.    Abdomen:Soft. Nontender. Nondistended     Psychiatric: Alert and oriented to person, place, and time. affect appropriate    Laboratory Data:     No results found for any visits on 09/18/22 (from the past 24 hour(s)).    Imaging Studies:    Admission and follow-up radiographic findings were reviewed and noted by me at time of evaluation with appropriate decisions made regarding pertinent abnormalities as pertaining to the surgical evaluation and can be found in  the patient's electronic medical record    Assessment:     Screening colonoscopy    Plan:    Discussed indications, risks and benefits of colonoscopy with the patient.  Discussed the possibility of polypectomy, biopsies, and possible repeat examinations.  Risks include bleeding, sedation risks, possibility of missed diagnosis of polyp or malignancy, and remote possibilities of perforation and death.  All questions were answered and informed consent was clearly obtained.    This note was partially created using voice recognition software and is inherently subject to errors including those of syntax and "sound alike " substitutions which may escape proof reading. In such instances, original meaning may be extrapolated by contextual derivation.    Clide Dales MD FACS RVT  [facility] -General Surgery

## 2022-09-18 NOTE — Anesthesia Preprocedure Evaluation (Signed)
ANESTHESIA PRE-OP EVALUATION  Planned Procedure: COLONOSCOPY  Review of Systems     anesthesia history negative               Pulmonary   past history of smoking ,   Cardiovascular    Hypertension and hyperlipidemia ,No peripheral edema,  Exercise Tolerance: > or = 4 METS   ,beta blocker therapy  ,taken in last 24 hours     GI/Hepatic/Renal           Endo/Other    obesity,   type 2 diabetes    Neuro/Psych/MS    OCD     Cancer                        Physical Assessment      Airway       Mallampati: IV    TM distance: 3 FB    Neck ROM: full  Mouth Opening: good.  Facial hair          Dental       Dentition intact             Pulmonary    Breath sounds clear to auscultation  (-) no rhonchi, no decreased breath sounds, no wheezes, no rales and no stridor     Cardiovascular    Rhythm: regular  Rate: Normal  (-) no friction rub, carotid bruit is not present, no peripheral edema and no murmur     Other findings              Plan  ASA 3     Planned anesthesia type: general     total intravenous anesthesia                          Anesthetic plan and risks discussed with patient  signed consent obtained          Patient's NPO status is appropriate for Anesthesia.

## 2022-09-18 NOTE — Anesthesia Postprocedure Evaluation (Signed)
Anesthesia Post Op Evaluation    Patient: Justin [name].  Procedure(s):  COLONOSCOPY    Last Vitals:Temperature: 36.1 C (97 F) (09/18/22 1138)  Heart Rate: 67 (09/18/22 1138)  BP (Non-Invasive): (!) 89/53 (09/18/22 1138)  Respiratory Rate: 16 (09/18/22 1138)  SpO2: 99 % (09/18/22 1138)    No notable events documented.    Patient is sufficiently recovered from the effects of anesthesia to participate in the evaluation and has returned to their pre-procedure level.  Patient location during evaluation: PACU       Patient participation: complete - patient participated  Level of consciousness: awake and alert and responsive to verbal stimuli    Pain score: 0  Pain management: adequate  Airway patency: patent    Anesthetic complications: no  Cardiovascular status: acceptable  Respiratory status: acceptable  Hydration status: acceptable  Patient post-procedure temperature: Pt Normothermic   PONV Status: Absent

## 2022-09-18 NOTE — OR Surgeon (Signed)
OPERATIVE NOTE    Patient Name: Justin [name], Justin [name].  Hospital MRN:: [id number]  Date of Birth: 1975-06-29  Date of Service: 09/18/2022     Pre-Operative Diagnosis: COLON SCREENING     Post-Operative Diagnosis:  Internal hemorrhoids    Procedure(s)/Description:  COLONOSCOPY: 42353 (CPT)     Attending Surgeon: Clide Dales, MD     Anesthesia Staff:  CRNA: Welton Flakes, CRNA    Anesthesia Type: .General     Estimated Blood Loss:  minimal    Specimens Removed: * No specimens in log *   * No orders in the log *     Complications:  None immediate    Indications for procedure:    Justin [name], Justin [name]. is a 47 y.o. male who presents for  screening colonoscopy . Risks, benefits, indications, and complications of procedure were discussed in great detail and informed signed consent obtained.          Intraoperative Findings:     The Olympus colonoscope was brought to the operating field gently inserted into the patient's rectum and advanced to the level of the cecum under direct visualization without difficulty.  Once this anatomic landmark was reached the scope was slowly retracted with circumferential visualization of all colonic and rectal walls with findings of internal hemorrhoids only.  There was no significant colonic diverticulosis.  There was no evidence of colonic or rectal polyps, tumors or AV malformations.   The colonic and rectal mucosa revealed no significant abnormalities where visualized.  Retroflexion of the scope revealed the presence of internal hemorrhoids. The prep was overall adequate for diagnosstic exam however very small abnormalities may be missed given the nature of the exam.          Description of Procedure           The patient was brought to the Operating Suite and placed in the supine position on the operating table.  Anesthesia/nursing personnel provided IV access as well as hemodynamic monitoring.  After appropriate lines and leads were placed, the patient was placed in the left  lateral decubitus position where they received total IV anesthesia.   After the patient was deemed comfortable, the Olympus colonoscope was brought to the operative field, gently inserted into the patient's rectum, and advanced to the level of the cecum under direct visualization with identification of the cecum through the use of normal anatomic landmarks.  Once the cecum was clearly identified, the scope was slowly retracted with circumferential visualization of all colonic and rectal walls with findings dictated above. In the rectum  the scope was retroflexed to evaluate the anorectal junction for the presence of hemorrhoids.  The scope was slowly straightened, the colon decompressed of as much air as possible and removed without difficulty.  The patient was returned to the post anesthesia care unit in stable condition having tolerated the procedure well.           Disposition:     Repeat colonoscopy in 10 years barring any change in symptoms    Clide Dales MD FACS RVT  [facility] -General Surgery

## 2022-09-19 ENCOUNTER — Telehealth (INDEPENDENT_AMBULATORY_CARE_PROVIDER_SITE_OTHER): Payer: Self-pay | Admitting: Surgery

## 2022-11-21 IMAGING — MR MRI FOOT RT WO CONTRAST
4 of 6 series · 19 of 40 positions shown · IV contrast (gadolinium)
Comparison: None outside films available.

﻿EXAM:  [id number]   MRI FOOT RT WO CONTRAST
INDICATION: 46-year-old with ulcer for about 1 month at the bottom of the big toe.  Pain.  History of diabetes.
TECHNIQUE: Multiplanar, multisequential MRI of the right foot was performed without gadolinium contrast.

[Series 8: T1 · sagittal · right · 4.5mm · 0.57mm/px · 7 of 22 slices shown (1 of 3)]
[im 1/22]
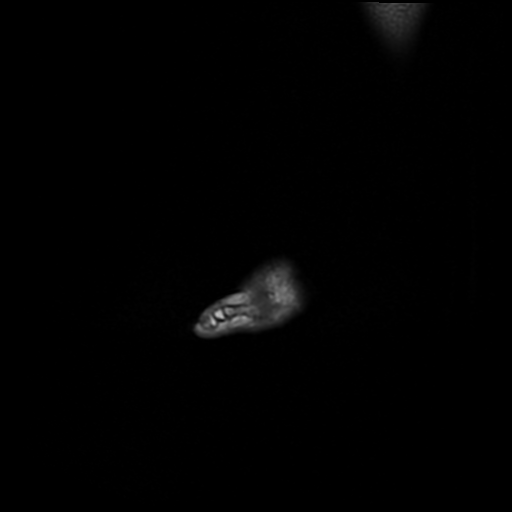
[im 4/22]
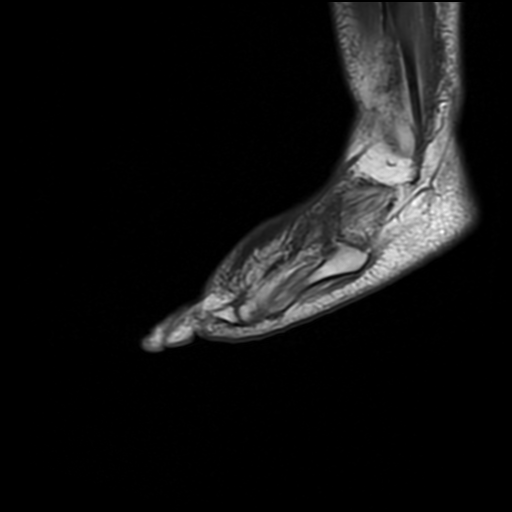
[im 8/22]
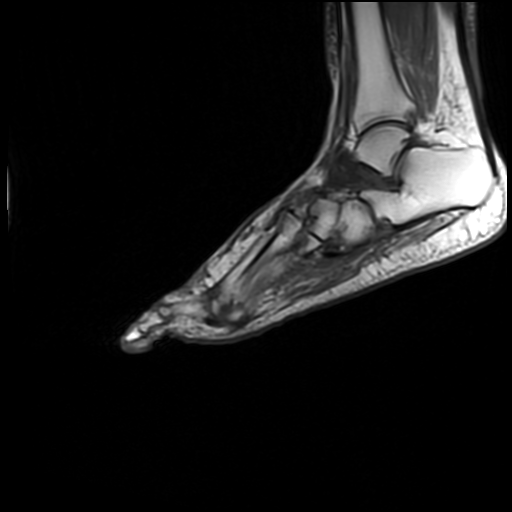
[im 11/22]
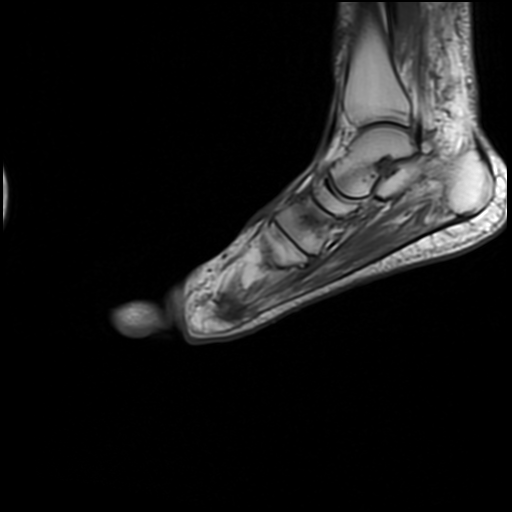
[im 15/22]
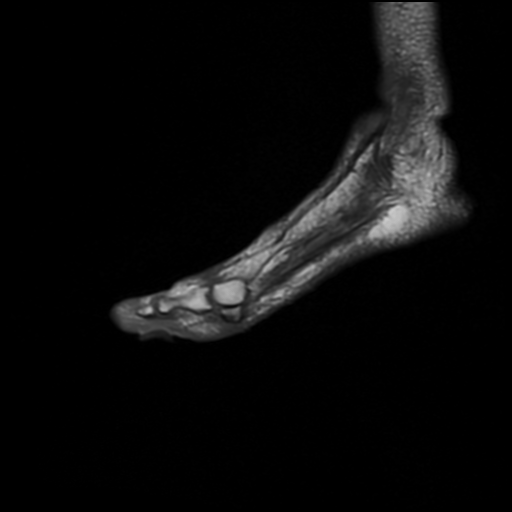
[im 18/22]
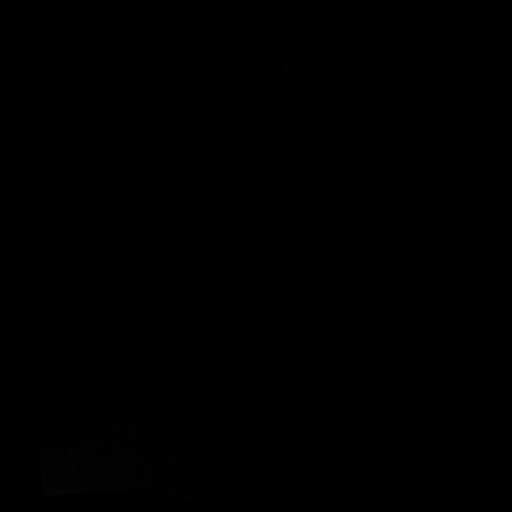
[im 22/22]
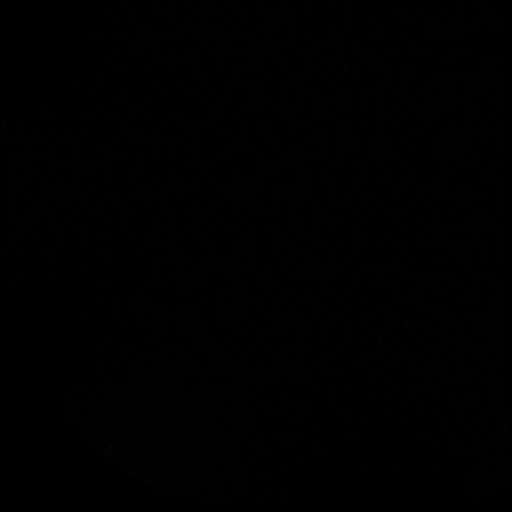

[Series 11: T1 · axial · right · 3.0mm · 0.49mm/px · z∈[-99,-33]mm · 3 of 22 slices shown (2 of 3)]
[im 5/22]
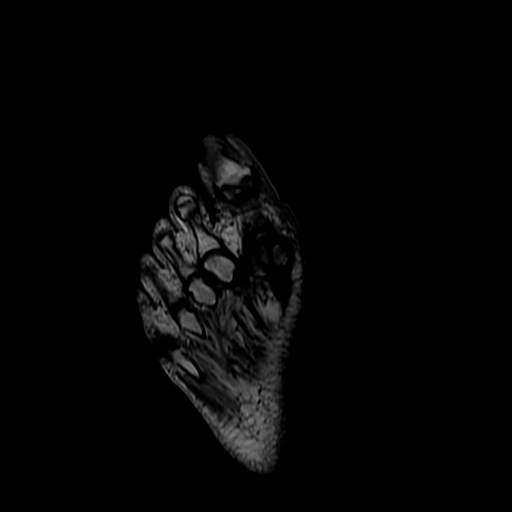
[im 13/22]
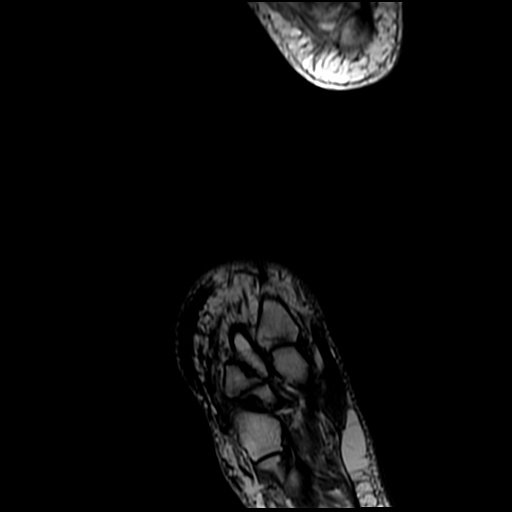
[im 22/22]
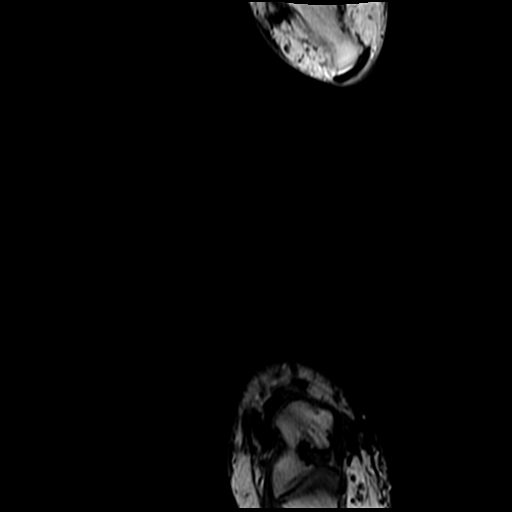

[Series 12: T2 fat-sat · axial · right · 3.0mm · 0.49mm/px · z∈[-115,-33]mm · 6 of 22 slices shown]
[im 1/22]
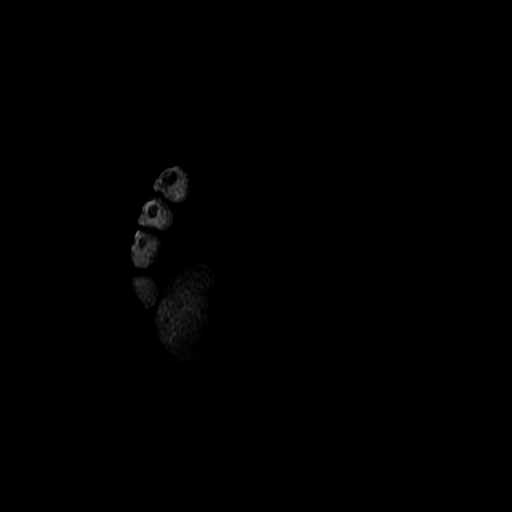
[im 5/22]
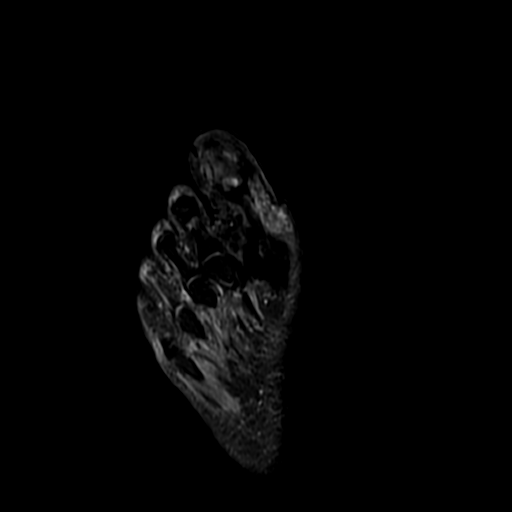
[im 9/22]
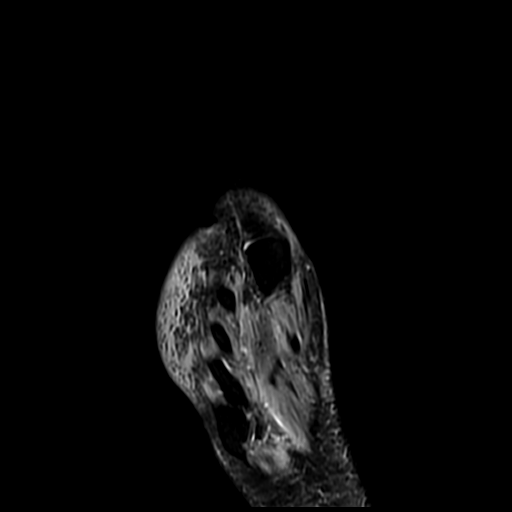
[im 13/22]
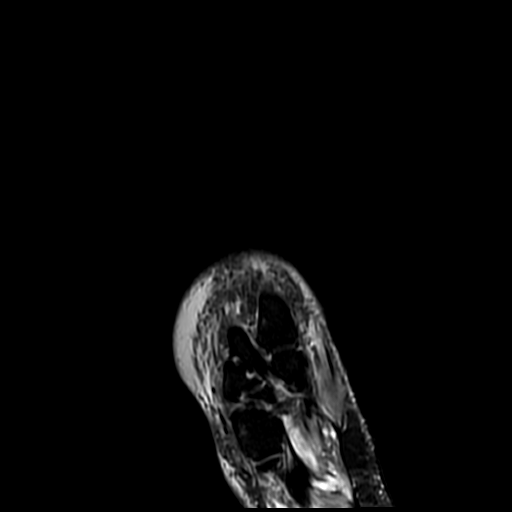
[im 17/22]
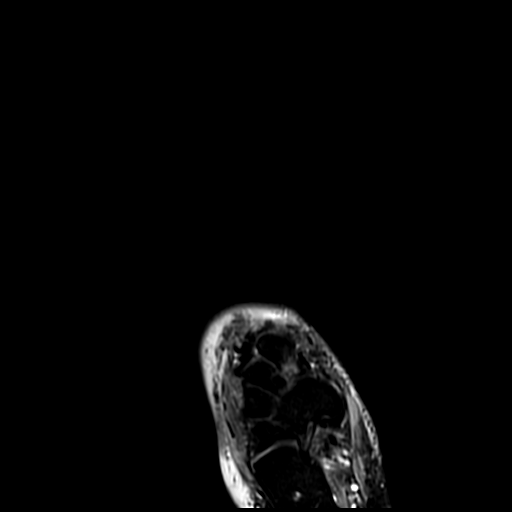
[im 22/22]
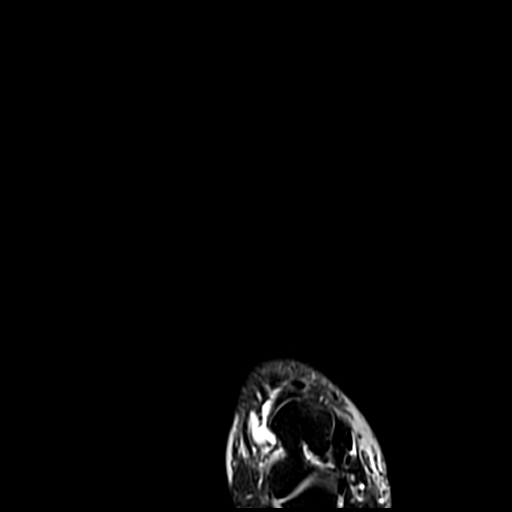

[Series 13: T1 · axial · right · 3.0mm · 0.49mm/px · z∈[-99,-33]mm · 3 of 22 slices shown (3 of 3)]
[im 5/22]
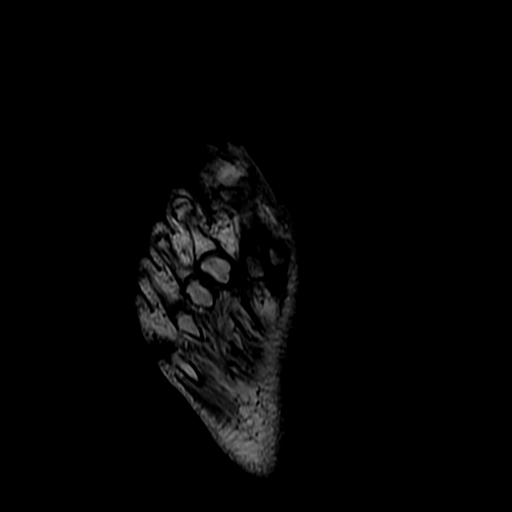
[im 13/22]
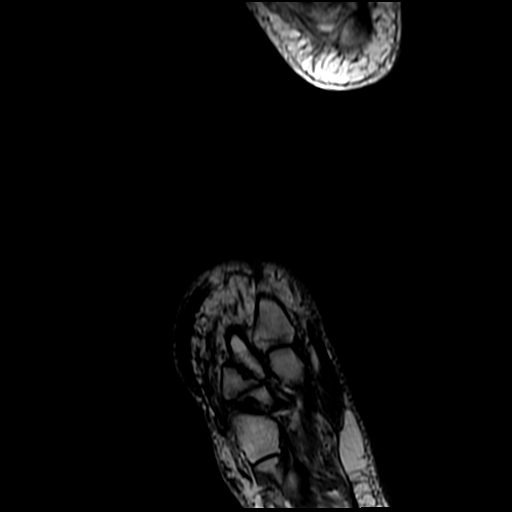
[im 22/22]
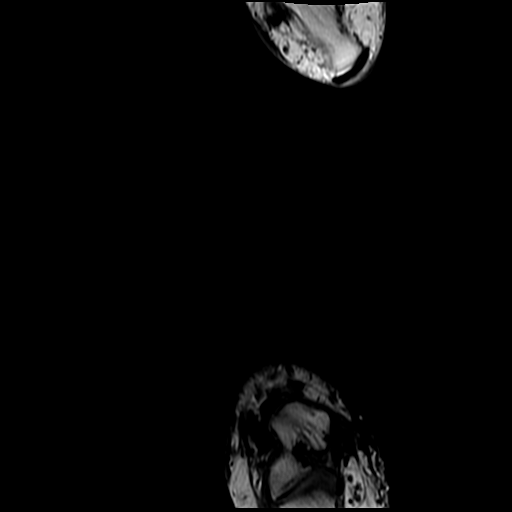

[19 of 40 positions shown; findings below may reference images not displayed]

FINDINGS: Focal soft tissue inflammation is noted on the plantar aspect of the head of the 1st metatarsal bone.  No MRI evidence of osteomyelitis of the 1st metatarsal and big toe are seen.  No well-circumscribed soft tissue abscess is noted.
IMPRESSION: No MRI evidence of osteomyelitis around the big toe and 1st metatarsal are seen.  Focal soft tissue inflammation on the plantar aspect of the head of the 1st metatarsal is suggested.  Overall examination is slightly limited due to lack of IV contrast.

## 2023-03-26 ENCOUNTER — Other Ambulatory Visit (HOSPITAL_COMMUNITY): Payer: Self-pay | Admitting: Podiatrist

## 2023-03-26 DIAGNOSIS — M86179 Other acute osteomyelitis, unspecified ankle and foot: Secondary | ICD-10-CM

## 2023-03-28 ENCOUNTER — Other Ambulatory Visit: Payer: Self-pay

## 2023-03-28 ENCOUNTER — Inpatient Hospital Stay
Admission: RE | Admit: 2023-03-28 | Discharge: 2023-03-28 | Disposition: A | Discharge status: Home / Self Care | Payer: BC Managed Care – PPO | Source: Ambulatory Visit | Attending: Podiatrist | Admitting: Podiatrist

## 2023-03-28 DIAGNOSIS — M86179 Other acute osteomyelitis, unspecified ankle and foot: Secondary | ICD-10-CM | POA: Insufficient documentation

## 2023-03-28 MED ORDER — GADOBUTROL 10 MMOL/10 ML (1 MMOL/ML) INTRAVENOUS SOLUTION
10.0000 mL | INTRAVENOUS | Status: AC
Start: 2023-03-28 — End: 2023-03-28
  Administered 2023-03-28: 10 mL via INTRAVENOUS

## 2023-05-08 ENCOUNTER — Other Ambulatory Visit (HOSPITAL_COMMUNITY): Payer: Self-pay

## 2023-05-08 IMAGING — DX XRAY CERVICAL SPINE COMPLETE
1 series · 7 of 7 positions shown · non-contrast
Comparison: None available.

﻿EXAM:  94694      XRAY CERVICAL SPINE COMPLETE
INDICATION: Neck pain. Right shoulder pain.

[Series 1: lateral · 0.14mm/px · 7 of 7 slices shown]
[im 1/7]
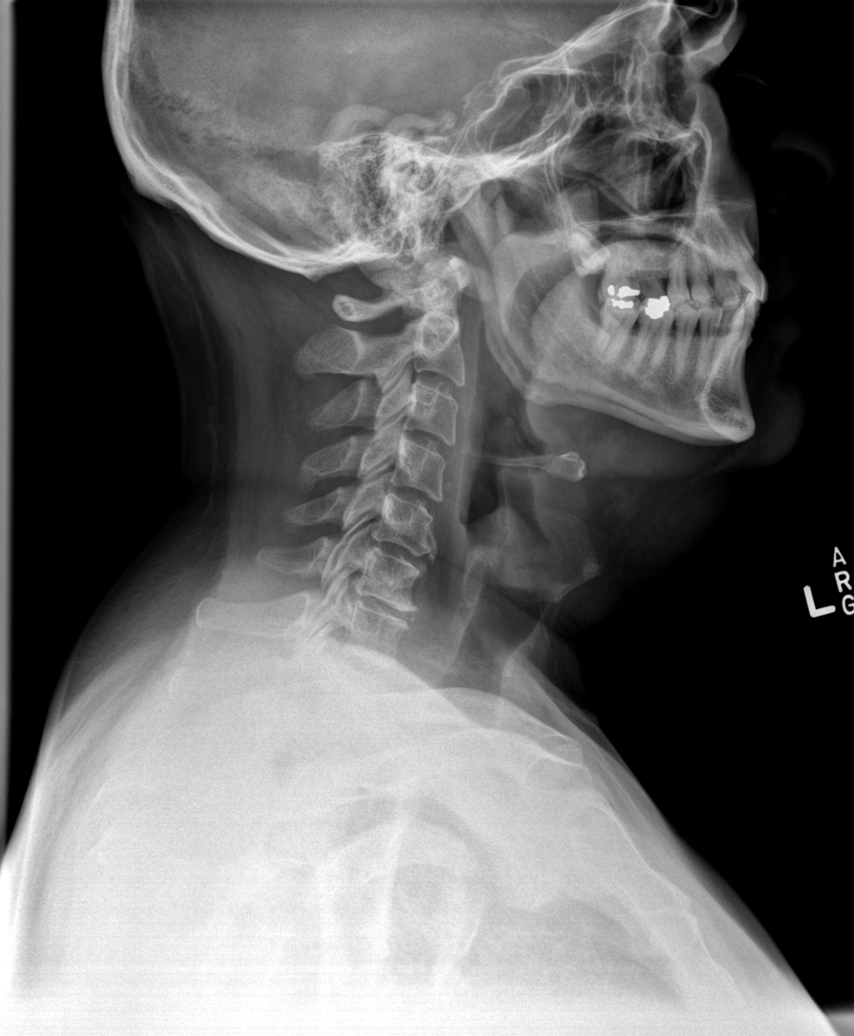
[im 2/7]
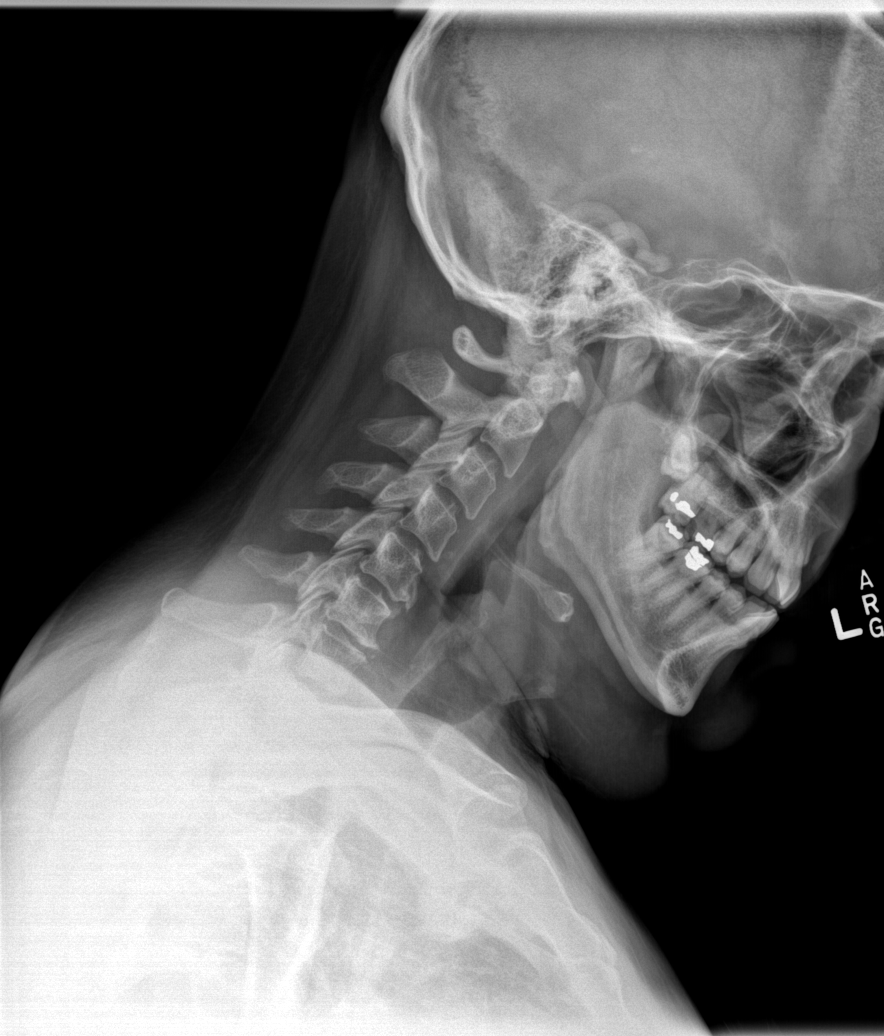
[im 3/7]
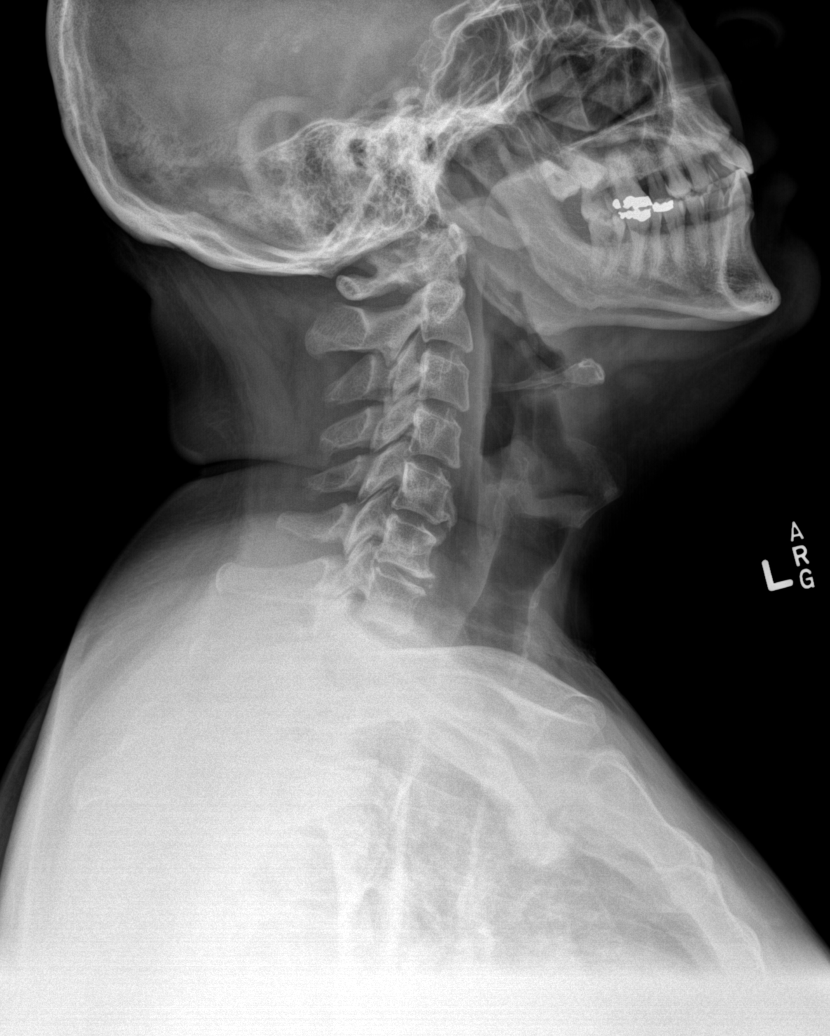
[im 4/7]
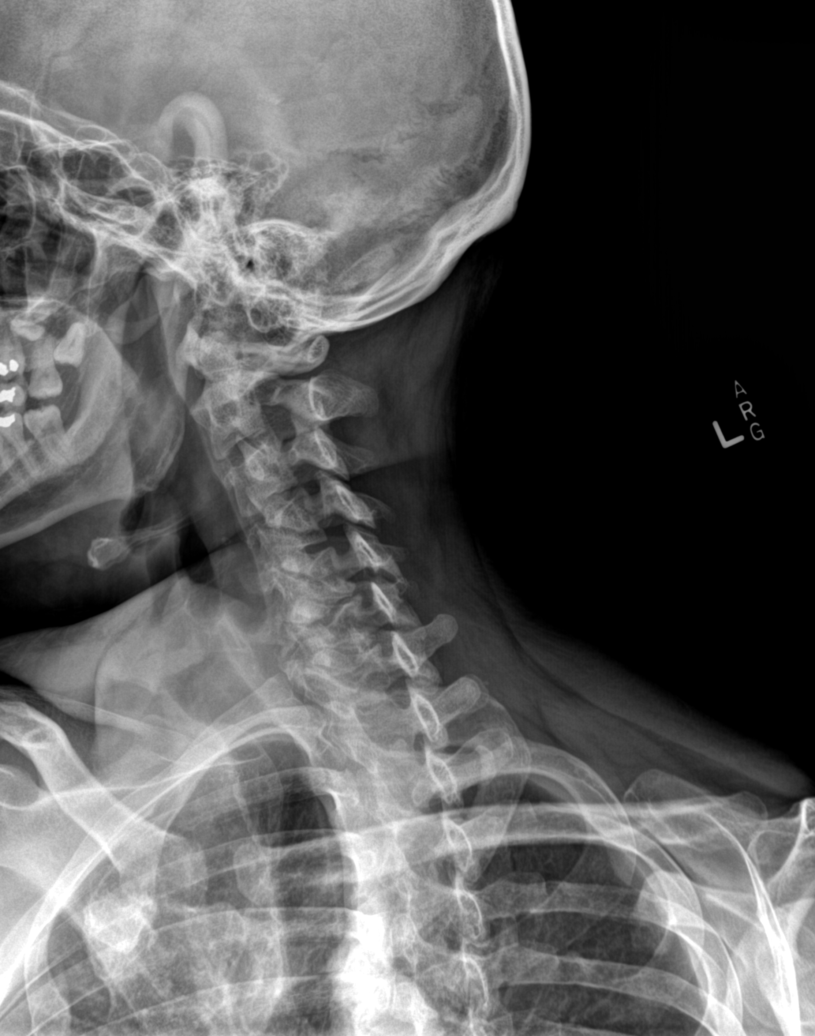
[im 5/7]
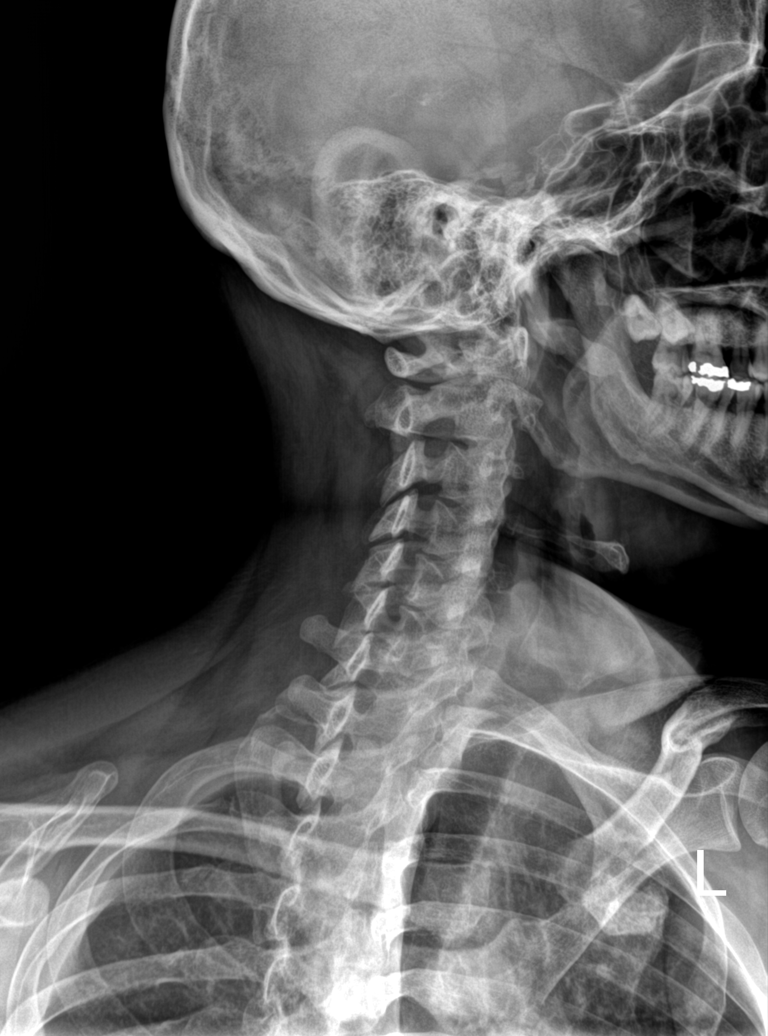
[im 6/7]
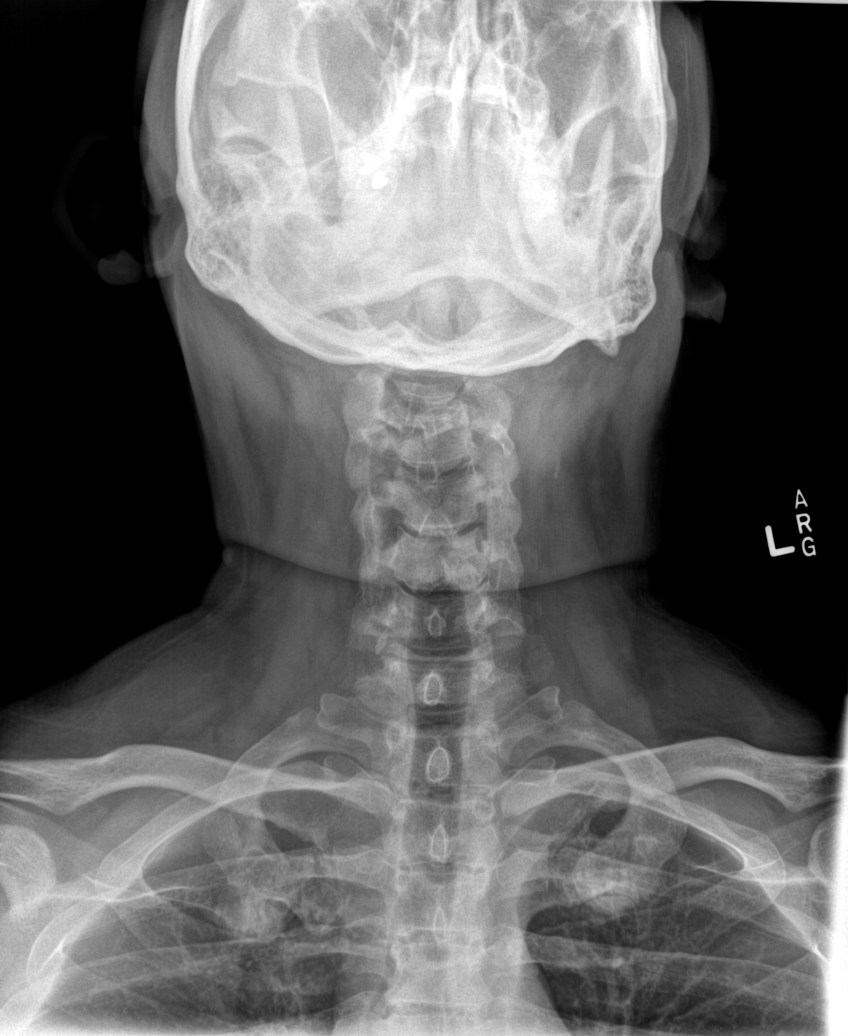
[im 7/7]
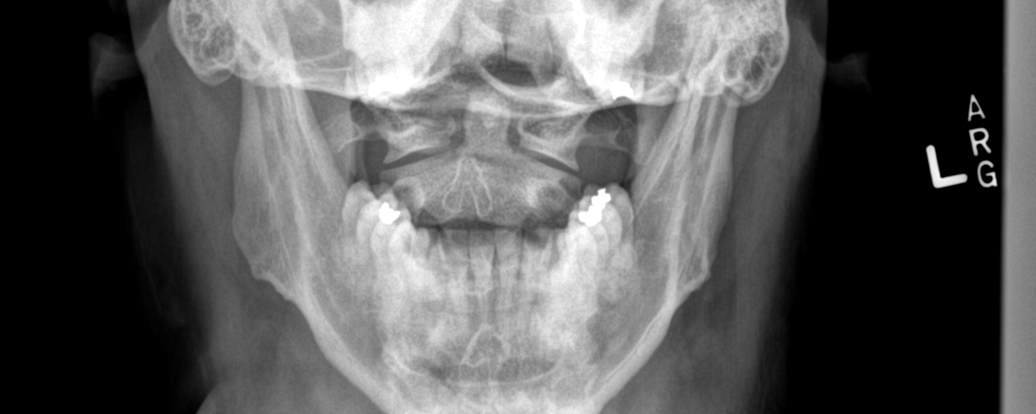

[7 of 7 positions shown; findings below may reference images not displayed]

FINDINGS: Significant degenerative disc changes and facet arthropathy are noted at C5-6 and C6-7 levels.  Compromise of neural foramina by osteophyte complex is suggested in the lower C-spine. Soft tissues are unremarkable.
IMPRESSION: Significant degenerative disc changes with facet arthropathy in the lower C-spine as mentioned above.  Compromise of neural foramina suggested by osteophyte complex.

## 2023-05-08 IMAGING — DX XRAY SHOULDER MINIMUM 2 VIEW RT
1 series · 2 of 2 positions shown · non-contrast
Comparison: None available.

﻿EXAM:  24242   XRAY SHOULDER MINIMUM 2 VIEW RT
INDICATION: Neck pain. Right shoulder pain.
TECHNIQUE: Two views.

[Series 1: apobl · 0.14mm/px · 2 of 2 slices shown]
[im 1/2]
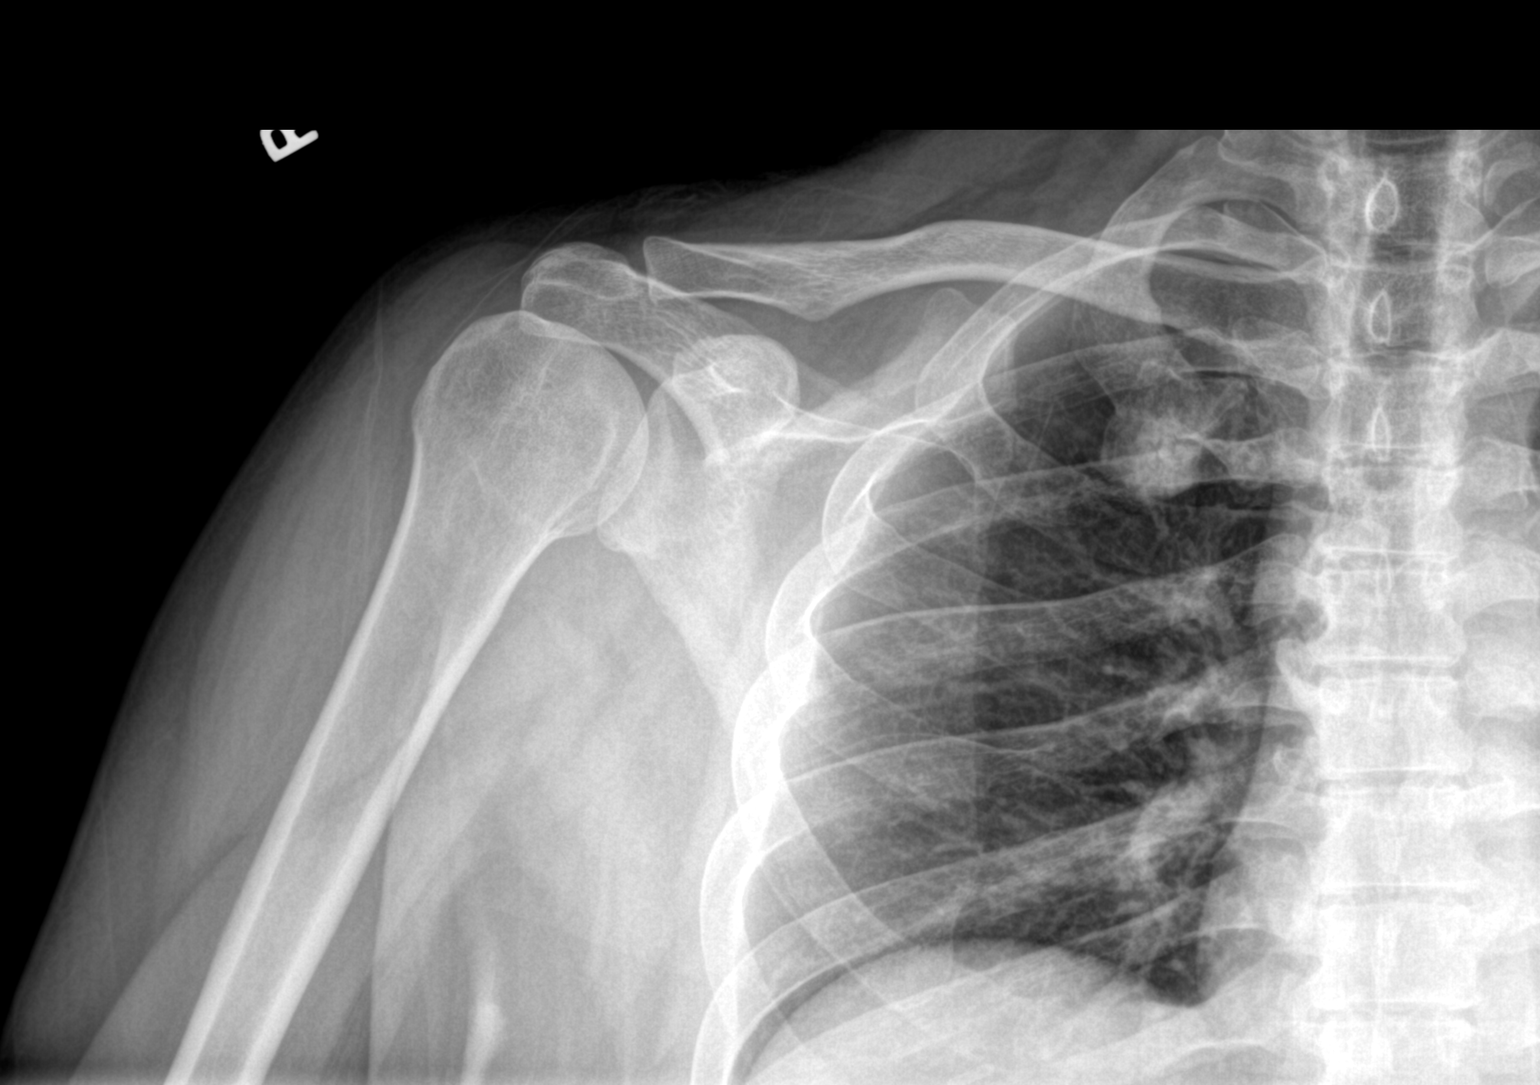
[im 2/2]
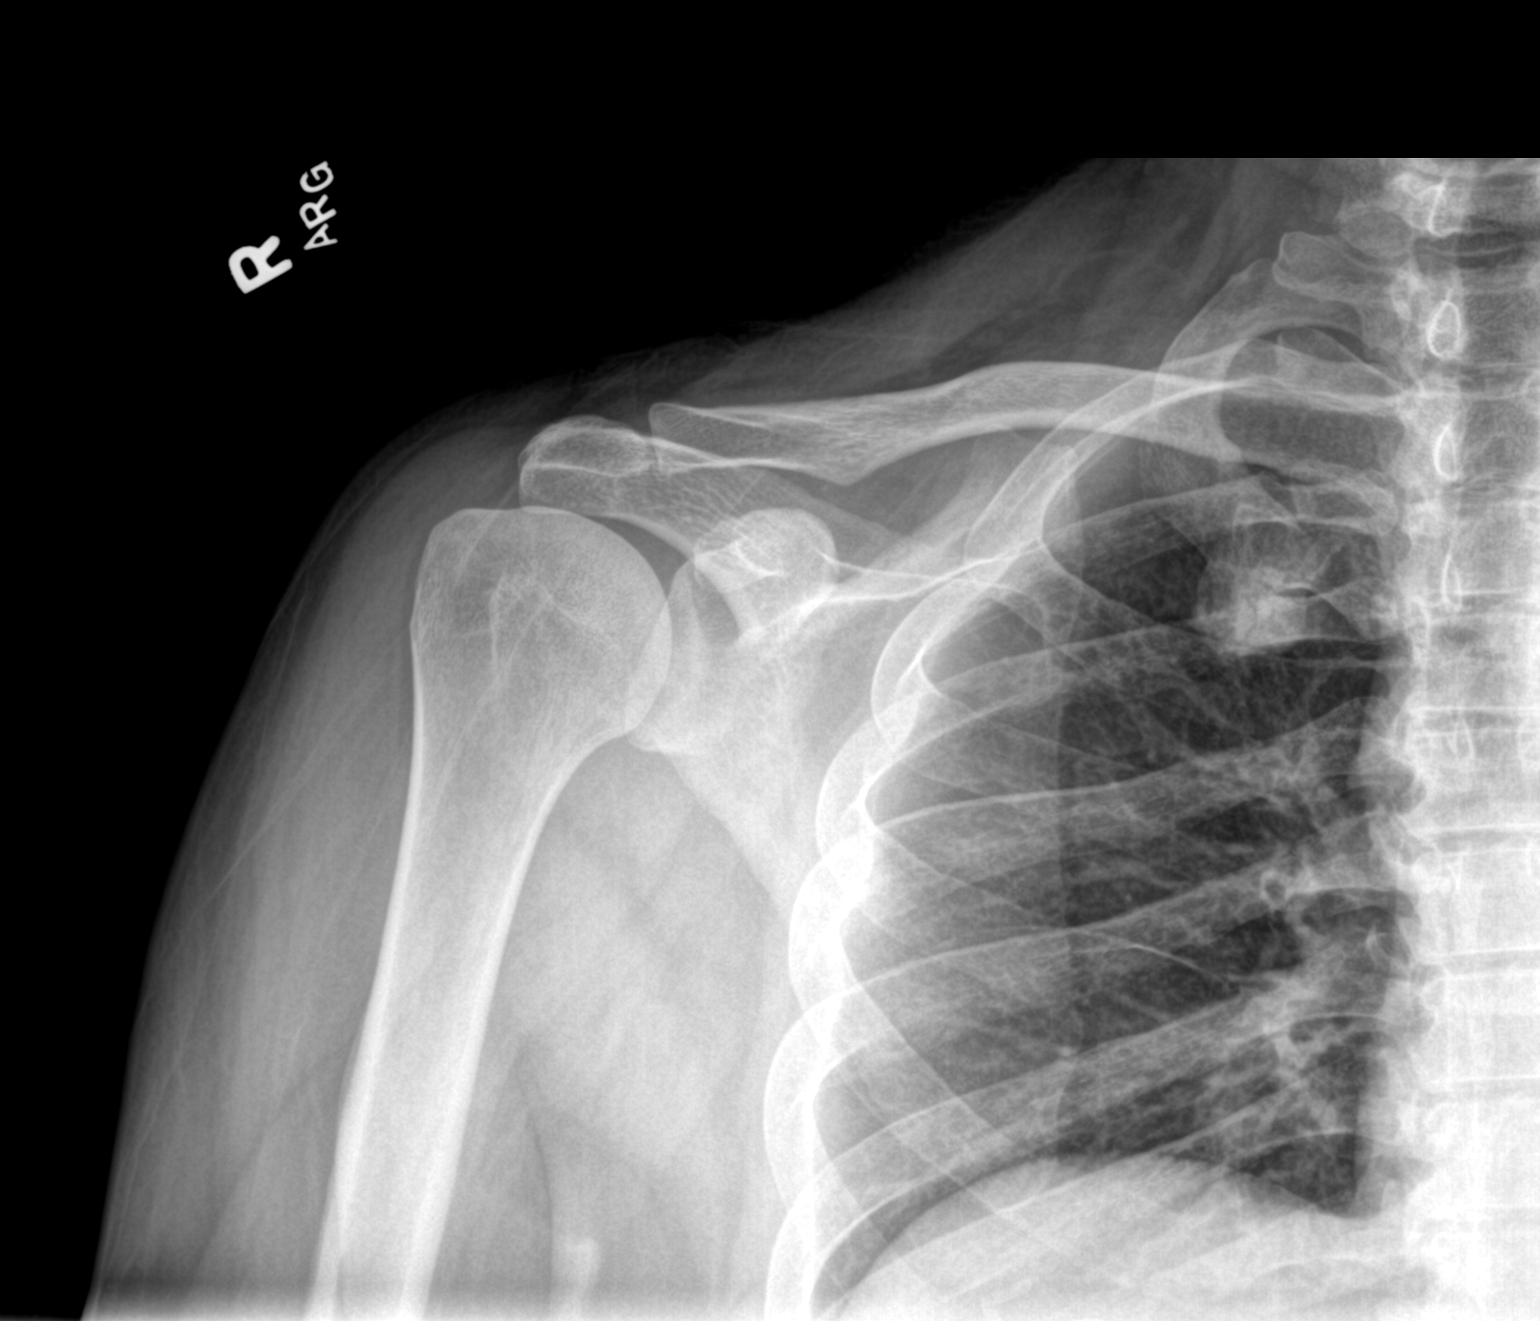

[2 of 2 positions shown; findings below may reference images not displayed]

FINDINGS: No acute bony lesions. Moderate degenerative changes of AC joint. Normal glenohumeral joint.
IMPRESSION: Moderate degenerative arthritis at the AC joint.

## 2023-07-10 ENCOUNTER — Other Ambulatory Visit (INDEPENDENT_AMBULATORY_CARE_PROVIDER_SITE_OTHER): Payer: Self-pay | Admitting: Surgery

## 2023-07-10 ENCOUNTER — Inpatient Hospital Stay (INDEPENDENT_AMBULATORY_CARE_PROVIDER_SITE_OTHER)
Admission: RE | Admit: 2023-07-10 | Discharge: 2023-07-10 | Disposition: A | Discharge status: Home / Self Care | Payer: BC Managed Care – PPO | Source: Ambulatory Visit

## 2023-07-10 ENCOUNTER — Ambulatory Visit (INDEPENDENT_AMBULATORY_CARE_PROVIDER_SITE_OTHER): Payer: BC Managed Care – PPO | Admitting: Surgery

## 2023-07-10 ENCOUNTER — Other Ambulatory Visit: Payer: Self-pay

## 2023-07-10 ENCOUNTER — Encounter (INDEPENDENT_AMBULATORY_CARE_PROVIDER_SITE_OTHER): Payer: Self-pay | Admitting: Surgery

## 2023-07-10 VITALS — BP 140/93 | HR 101 | Temp 97.8°F | Ht 70.0 in | Wt 237.4 lb

## 2023-07-10 DIAGNOSIS — Z89419 Acquired absence of unspecified great toe: Secondary | ICD-10-CM

## 2023-07-10 DIAGNOSIS — R600 Localized edema: Secondary | ICD-10-CM

## 2023-07-10 DIAGNOSIS — I872 Venous insufficiency (chronic) (peripheral): Secondary | ICD-10-CM

## 2023-07-10 DIAGNOSIS — E119 Type 2 diabetes mellitus without complications: Secondary | ICD-10-CM

## 2023-07-10 DIAGNOSIS — E7800 Pure hypercholesterolemia, unspecified: Secondary | ICD-10-CM

## 2023-07-10 DIAGNOSIS — M79606 Pain in leg, unspecified: Secondary | ICD-10-CM

## 2023-07-10 DIAGNOSIS — I739 Peripheral vascular disease, unspecified: Secondary | ICD-10-CM

## 2023-07-10 DIAGNOSIS — Z89412 Acquired absence of left great toe: Secondary | ICD-10-CM

## 2023-07-10 DIAGNOSIS — M7989 Other specified soft tissue disorders: Secondary | ICD-10-CM

## 2023-07-12 DIAGNOSIS — M79605 Pain in left leg: Secondary | ICD-10-CM

## 2023-07-12 DIAGNOSIS — I739 Peripheral vascular disease, unspecified: Secondary | ICD-10-CM

## 2023-07-13 ENCOUNTER — Encounter (INDEPENDENT_AMBULATORY_CARE_PROVIDER_SITE_OTHER): Payer: Self-pay | Admitting: Surgery

## 2023-07-13 NOTE — Progress Notes (Signed)
Office visit      Reason for Visit: Peripheral Vascular Disease    History of Present Illness  [name] presents  for evaluation of possible peripheral vascular disease.  Referred by Justin [name].  Describes development of a diabetic ulcer involving bilateral great toes with history of debridement and wound center care.  The left great toe progressed to a nonsalvageable digit status post great toe amputation by a podiatrist in [place].  Has associated significant venous insufficiency with edema bilateral lower extremities which is now better.  Off antibiotics.  The wound has healed.  Presents today for evaluation of lower extremities for potential peripheral vascular disease.  Denies claudication.  Cardiovascular risk factors include diabetes and hyperlipidemia.  Accompanied by his family today    I have reviewed the patient's provided medical records and diagnostic testing including laboratory values, imaging results, documented encounters and providers notes with all pertinent information noted with respect to today's evaluation serving as unique tests and sources as a component of the medical decision making process for this encounter relevant to the patients independent evaluation by me today.        Patient Data  Patient History  Past Medical History:   Diagnosis Date    Diabetes mellitus, type 2 (CMS HCC)     HLD (hyperlipidemia)     Hypertension     OCD (obsessive compulsive disorder)          Past Surgical History:   Procedure Laterality Date    COLONOSCOPY      CYSTECTOMY      HIP SURGERY      HX APPENDECTOMY      LAPOROTOMY    SURGERY SCROTAL / TESTICULAR      TOE AMPUTATION Left     great toe         Family Medical History:    None         Social History     Tobacco Use    Smoking status: Former     Types: Cigarettes    Smokeless tobacco: Never    Tobacco comments:     QUIT 4 YRS AGO   Substance Use Topics    Alcohol use: Yes     Comment: SOCIAL    Drug use: Never        The above  documented section regarding past medical, past surgical, family, and social history (PMFSH) has been reviewed and considered and to the best of my knowledge represents a valid and accurate reflection of the patient's previous pertinent experiences documented by multiple providers and participants of the EMR.I cannot attest to all entries but do no recognize any gross inaccuracies as the data is a common field across all providers  Further history pertinent to the current encounter will be found as referenced       Physical Examination:    Vitals:    07/10/23 0942   BP: (!) 140/93   Pulse: (!) 101   Temp: 36.6 C (97.8 F)   SpO2: 100%   Weight: 108 kg (237 lb 6.4 oz)   Height: 1.778 m (5\' 10" )   BMI: 34.06        General:appropriate for age. in no acute distress.  HEENT:Atraumatic, Normocephalic.   Lungs:Nonlabored breathing with symmetric expansion  Heart:Regular wth respect to rate   Abdomen:Soft. Nontender. Nondistended  No peritoneal signs  Skin:No Rashes. No ulcers. Skin warm.  No open wounds bilateral lower extremities.  Bilateral feet with edema which is improved  from provided pictures by the family.  Palpable pedal pulses bilaterally  Psychiatric:Alert and oriented to person, place, and time. affect appropriate            Diagnosis:  ENCOUNTER DIAGNOSES     ICD-10-CM   1. Leg swelling  M79.89   2. Type 2 diabetes mellitus (CMS HCC)  E11.9   3. History of amputation of great toe (CMS HCC)  Z89.419   4. Hypercholesterolemia  E78.00              Plan:  Noninvasive arterial study in the office today with a non rest an ABI of greater than 1 bilaterally.  No evidence of significant peripheral vascular disease.  Duplex unremarkable for venous occlusive disease left lower extremity.  Recommend treatment of venous insufficiency with continue use of compression stockings.  Treat modifiable risk factors.  Follow up here as needed.  All questions answered.        This note may have been partially generated using MModal  Fluency Direct system, and there may be some incorrect words, spellings, and punctuation that were not noted in checking the note before saving, though effort was made to avoid such errors.      Clide Dales MD FACS RVT  [facility] -General Surgery

## 2023-08-22 ENCOUNTER — Other Ambulatory Visit (HOSPITAL_COMMUNITY): Payer: Self-pay

## 2023-10-09 ENCOUNTER — Other Ambulatory Visit (HOSPITAL_COMMUNITY): Payer: Self-pay

## 2023-10-25 ENCOUNTER — Other Ambulatory Visit (INDEPENDENT_AMBULATORY_CARE_PROVIDER_SITE_OTHER): Payer: Self-pay

## 2023-10-25 ENCOUNTER — Encounter (INDEPENDENT_AMBULATORY_CARE_PROVIDER_SITE_OTHER): Payer: Self-pay

## 2023-10-25 DIAGNOSIS — Z7189 Other specified counseling: Secondary | ICD-10-CM

## 2023-10-25 NOTE — Nursing Note (Signed)
 Care Coordination Identified Note    DATE: 10/25/2023, 13:08  PATIENT NAME: Justin [name].  MRN: [id number]  DOB: 26-Aug-1975  PCP: Clemencia Curry, FNP  INSURANCE: Payor: BLUE CROSS BLUE SHIELD / Plan: OTHER BCBS OUT OF STATE PPO / Product Type: PPO /     Received referral for Disease Management from chart review. The patient has been marked as identified at this time for potential enrollment. Chart review completed.  A future outgoing call will be made to patient within 48 hours to discuss Care Coordination and obtain verbal consent if interested.     Sharlee Dawes, RN, CCM     10/25/2023  13:08

## 2023-10-29 ENCOUNTER — Other Ambulatory Visit (INDEPENDENT_AMBULATORY_CARE_PROVIDER_SITE_OTHER): Payer: Self-pay

## 2023-10-29 DIAGNOSIS — Z7189 Other specified counseling: Secondary | ICD-10-CM

## 2023-10-29 NOTE — Nursing Note (Signed)
 Patient contacted by  via telephone to discuss and offer Care Coordination Services. Patient verbalized that he/she was not interested in services at this time and declined offer. Patient was  provided all contact information and advised  to call with any questions, concerns, or needs that may arise in the future. Patient verbalized understanding and agreement. PCP and/or referral source made aware of declined status. No follow-up contact indicated by at this time.     Sharlee Dawes, RN, CCM  10/29/2023, 10:49

## 2023-11-06 ENCOUNTER — Other Ambulatory Visit (HOSPITAL_COMMUNITY): Payer: Self-pay

## 2023-11-16 ENCOUNTER — Other Ambulatory Visit: Payer: Self-pay

## 2023-11-16 ENCOUNTER — Emergency Department (HOSPITAL_BASED_OUTPATIENT_CLINIC_OR_DEPARTMENT_OTHER)

## 2023-11-16 ENCOUNTER — Encounter (HOSPITAL_BASED_OUTPATIENT_CLINIC_OR_DEPARTMENT_OTHER): Payer: Self-pay

## 2023-11-16 ENCOUNTER — Emergency Department
Admission: EM | Admit: 2023-11-16 | Discharge: 2023-11-16 | Disposition: A | Discharge status: Other Acute Inpatient Hospital | Source: Home / Self Care | Attending: Family | Admitting: Family

## 2023-11-16 DIAGNOSIS — N200 Calculus of kidney: Secondary | ICD-10-CM

## 2023-11-16 DIAGNOSIS — D72829 Elevated white blood cell count, unspecified: Secondary | ICD-10-CM | POA: Insufficient documentation

## 2023-11-16 DIAGNOSIS — N132 Hydronephrosis with renal and ureteral calculous obstruction: Secondary | ICD-10-CM

## 2023-11-16 DIAGNOSIS — N136 Pyonephrosis: Secondary | ICD-10-CM | POA: Insufficient documentation

## 2023-11-16 DIAGNOSIS — N218 Other lower urinary tract calculus: Secondary | ICD-10-CM

## 2023-11-16 DIAGNOSIS — R1031 Right lower quadrant pain: Secondary | ICD-10-CM

## 2023-11-16 DIAGNOSIS — N134 Hydroureter: Secondary | ICD-10-CM

## 2023-11-16 HISTORY — DX: Gastrointestinal hemorrhage, unspecified: K92.2

## 2023-11-16 HISTORY — DX: Complete traumatic amputation of unspecified great toe, initial encounter: S98.119A

## 2023-11-16 HISTORY — DX: Anal abscess: K61.0

## 2023-11-16 HISTORY — DX: Juvenile osteochondrosis of tibia tubercle, unspecified leg: M92.529

## 2023-11-16 HISTORY — DX: Torsion of appendix epididymis: N44.04

## 2023-11-16 LAB — CBC WITH DIFF
BASOPHIL #: 0.06 10*3/uL (ref 0.00–0.10)
BASOPHIL %: 0 % (ref 0–1)
EOSINOPHIL #: 0.26 10*3/uL (ref 0.00–0.60)
EOSINOPHIL %: 2 % (ref 1–8)
HCT: 36.5 % — ABNORMAL LOW (ref 36.7–47.1)
HGB: 12.5 g/dL (ref 12.5–16.3)
LYMPHOCYTE #: 3 10*3/uL (ref 1.00–3.00)
LYMPHOCYTE %: 19 % (ref 15–43)
MCH: 30.2 pg (ref 23.8–33.4)
MCHC: 34.3 g/dL (ref 32.5–36.3)
MCV: 88 fL (ref 73.0–96.2)
MONOCYTE #: 1.28 10*3/uL — ABNORMAL HIGH (ref 0.30–1.00)
MONOCYTE %: 8 % (ref 6–14)
MPV: 7.1 fL — ABNORMAL LOW (ref 7.4–11.4)
NEUTROPHIL #: 11.49 10*3/uL — ABNORMAL HIGH (ref 1.85–7.84)
NEUTROPHIL %: 71 % (ref 44–74)
PLATELETS: 313 10*3/uL (ref 140–440)
RBC: 4.15 10*6/uL (ref 4.06–5.63)
RDW: 16 % (ref 12.1–16.2)
WBC: 16.1 10*3/uL — ABNORMAL HIGH (ref 3.6–10.2)

## 2023-11-16 LAB — COMPREHENSIVE METABOLIC PANEL, NON-FASTING
ALBUMIN/GLOBULIN RATIO: 1.1 (ref 0.8–1.4)
ALBUMIN: 3.9 g/dL (ref 3.4–5.0)
ALKALINE PHOSPHATASE: 102 U/L (ref 46–116)
ALT (SGPT): 33 U/L (ref <=78)
ANION GAP: 10 mmol/L (ref 4–13)
AST (SGOT): 16 U/L (ref 15–37)
BILIRUBIN TOTAL: 0.5 mg/dL (ref 0.2–1.0)
BUN/CREA RATIO: 18
BUN: 26 mg/dL — ABNORMAL HIGH (ref 7–18)
CALCIUM, CORRECTED: 10.5 mg/dL
CALCIUM: 10.4 mg/dL — ABNORMAL HIGH (ref 8.5–10.1)
CHLORIDE: 101 mmol/L (ref 98–107)
CO2 TOTAL: 26 mmol/L (ref 21–32)
CREATININE: 1.44 mg/dL — ABNORMAL HIGH (ref 0.70–1.30)
ESTIMATED GFR: 61 mL/min/{1.73_m2} (ref >59)
GLOBULIN: 3.7
GLUCOSE: 107 mg/dL — ABNORMAL HIGH (ref 74–106)
OSMOLALITY, CALCULATED: 279 mosm/kg (ref 270–290)
POTASSIUM: 4.3 mmol/L (ref 3.5–5.1)
PROTEIN TOTAL: 7.6 g/dL (ref 6.4–8.2)
SODIUM: 137 mmol/L (ref 136–145)

## 2023-11-16 LAB — URINALYSIS, MICROSCOPIC

## 2023-11-16 LAB — URINALYSIS, MACRO/MICRO
BILIRUBIN: NEGATIVE mg/dL
GLUCOSE: NEGATIVE mg/dL
KETONES: NEGATIVE mg/dL
LEUKOCYTES: NEGATIVE WBCs/uL
NITRITE: NEGATIVE
PH: 6 (ref 4.6–8.0)
PROTEIN: NEGATIVE mg/dL
SPECIFIC GRAVITY: <=1.005 (ref 1.003–1.035)
UROBILINOGEN: 0.2 mg/dL (ref 0.2–1.0)

## 2023-11-16 MED ORDER — ONDANSETRON HCL (PF) 4 MG/2 ML INJECTION SOLUTION
4.0000 mg | INTRAMUSCULAR | Status: AC
Start: 2023-11-16 — End: 2023-11-16
  Administered 2023-11-16: 4 mg via INTRAVENOUS

## 2023-11-16 MED ORDER — ONDANSETRON HCL (PF) 4 MG/2 ML INJECTION SOLUTION
INTRAMUSCULAR | Status: AC
Start: 2023-11-16 — End: 2023-11-16
  Filled 2023-11-16: qty 2

## 2023-11-16 MED ORDER — METHYLPREDNISOLONE SOD SUCC 125 MG SOLUTION FOR INJECTION WRAPPER
INTRAVENOUS | Status: AC
Start: 2023-11-16 — End: 2023-11-16
  Filled 2023-11-16: qty 2

## 2023-11-16 MED ORDER — KETOROLAC 30 MG/ML (1 ML) INJECTION SOLUTION
30.0000 mg | INTRAMUSCULAR | Status: AC
Start: 2023-11-16 — End: 2023-11-16
  Administered 2023-11-16: 30 mg via INTRAVENOUS

## 2023-11-16 MED ORDER — PIPERACILLIN-TAZOBACTAM 4.5 GRAM INTRAVENOUS SOLUTION
INTRAVENOUS | Status: AC
Start: 2023-11-16 — End: 2023-11-16
  Filled 2023-11-16: qty 20

## 2023-11-16 MED ORDER — KETOROLAC 30 MG/ML (1 ML) INJECTION SOLUTION
INTRAMUSCULAR | Status: AC
Start: 2023-11-16 — End: 2023-11-16
  Filled 2023-11-16: qty 1

## 2023-11-16 MED ORDER — TAMSULOSIN 0.4 MG CAPSULE
ORAL_CAPSULE | ORAL | Status: AC
Start: 2023-11-16 — End: 2023-11-16
  Filled 2023-11-16: qty 1

## 2023-11-16 MED ORDER — TAMSULOSIN 0.4 MG CAPSULE
0.4000 mg | ORAL_CAPSULE | Freq: Every evening | ORAL | Status: DC
Start: 2023-11-16 — End: 2023-11-16
  Administered 2023-11-16: 0.4 mg via ORAL

## 2023-11-16 MED ORDER — SODIUM CHLORIDE 0.9 % (FLUSH) INJECTION SYRINGE
3.0000 mL | INJECTION | INTRAMUSCULAR | Status: DC | PRN
Start: 2023-11-16 — End: 2023-11-16

## 2023-11-16 MED ORDER — SODIUM CHLORIDE 0.9 % (FLUSH) INJECTION SYRINGE
3.0000 mL | INJECTION | Freq: Three times a day (TID) | INTRAMUSCULAR | Status: DC
Start: 2023-11-16 — End: 2023-11-16

## 2023-11-16 MED ORDER — LORAZEPAM 2 MG/ML INJECTION SOLUTION
INTRAMUSCULAR | Status: AC
Start: 2023-11-16 — End: 2023-11-16
  Filled 2023-11-16: qty 1

## 2023-11-16 MED ORDER — SODIUM CHLORIDE 0.9 % INTRAVENOUS PIGGYBACK
4.5000 g | INTRAVENOUS | Status: AC
Start: 2023-11-16 — End: 2023-11-16
  Administered 2023-11-16: 0 g via INTRAVENOUS
  Administered 2023-11-16: 4.5 g via INTRAVENOUS

## 2023-11-16 MED ORDER — SODIUM CHLORIDE 0.9 % IV BOLUS
1000.0000 mL | INJECTION | Status: AC
Start: 2023-11-16 — End: 2023-11-16
  Administered 2023-11-16: 0 mL via INTRAVENOUS
  Administered 2023-11-16: 1000 mL via INTRAVENOUS

## 2023-11-16 NOTE — ED Provider Notes (Signed)
 [facility], [place] - Emergency Department  ED Primary Note  History of Present Illness   Justin [name]. is a 48 y.o. male who had concerns including Flank Pain. Rt flank pain radiates to rt testicle area hx of stones and torsion.  Review of Systems   Constitutional: No fever, chills or weakness   Skin: No rash or diaphoresis  HENT: No headaches, or congestion  Eyes: No vision changes or photophobia   Cardio: No chest pain, palpitations or leg swelling   Respiratory: No cough, wheezing or SOB  GI:  No nausea, vomiting or stool changes  GU:  No dysuria, hematuria, or increased frequency+ flank pain   MSK: No muscle aches, joint or back pain  Neuro: No seizures, LOC, numbness, tingling, or focal weakness  Psychiatric: No depression, SI or substance abuse  All other systems reviewed and are negative.      Physical Exam   ED Triage Vitals [11/16/23 1356]   BP (Non-Invasive) (!) 140/90   Heart Rate 94   Respiratory Rate 20   Temperature 36.1 C (97 F)   SpO2 98 %   Weight 111 kg (245 lb)   Height 1.778 m (5\' 10" )     Constitutional:  48 y.o. male who appears in no distress. Normal color, no cyanosis.   HENT:   Head: Normocephalic and atraumatic.   Mouth/Throat: Oropharynx is clear and moist.   Eyes: EOMI, PERRL   Neck: Trachea midline. Neck supple.  Cardiovascular: RRR, No murmurs, rubs or gallops. Intact distal pulses.  Pulmonary/Chest: BS equal bilaterally. No respiratory distress. No wheezes, rales or chest tenderness.   Abdominal: Bowel sounds present and normal. Abdomen soft, no tenderness, no rebound and no guarding.  Back: No midline spinal tenderness, no paraspinal tenderness, + rt  CVA tenderness.           Musculoskeletal: No edema, tenderness or deformity.  Skin: warm and dry. No rash, erythema, pallor or cyanosis  Psychiatric: normal mood and affect. Behavior is normal.   Neurological: Patient keenly alert and responsive, easily able to raise eyebrows, facial muscles/expressions  symmetric, speaking in fluent sentences, moving all extremities equally and fully, normal gait  Patient Data   Labs Ordered/Reviewed   COMPREHENSIVE METABOLIC PANEL, NON-FASTING - Abnormal; Notable for the following components:       Result Value    BUN 26 (*)     CREATININE 1.44 (*)     CALCIUM 10.4 (*)     GLUCOSE 107 (*)     All other components within normal limits    Narrative:     Estimated Glomerular Filtration Rate (eGFR) is calculated using the CKD-EPI (2021) equation, intended for patients 87 years of age and older. If gender is not documented or "unknown", there will be no eGFR calculation.   CBC WITH DIFF - Abnormal; Notable for the following components:    WBC 16.1 (*)     HCT 36.5 (*)     MPV 7.1 (*)     NEUTROPHIL # 11.49 (*)     MONOCYTE # 1.28 (*)     All other components within normal limits   CBC/DIFF    Narrative:     The following orders were created for panel order CBC/DIFF.  Procedure                               Abnormality  Status                     ---------                               -----------         ------                     CBC WITH DIFF[[id number]]                Abnormal            Final result                 Please view results for these tests on the individual orders.   URINALYSIS WITH REFLEX MICROSCOPIC AND CULTURE IF POSITIVE    Narrative:     The following orders were created for panel order URINALYSIS WITH REFLEX MICROSCOPIC AND CULTURE IF POSITIVE.  Procedure                               Abnormality         Status                     ---------                               -----------         ------                     URINALYSIS, MACRO/MICRO[[id number]]                          In process                   Please view results for these tests on the individual orders.   URINALYSIS, MACRO/MICRO     CT ABDOMEN PELVIS WO IV CONTRAST   Final Result by Edi, Radresults In (06/07 1448)   1. Obstructing 3 to 4 mm stone at the right UVJ causing mild right hydronephrosis  and hydroureter. Background bilateral nephrolithiasis.   2. Please see above for additional findings and details.            Radiologist location ID: [id number]           Medical Decision Making   Diff dx of kidney stone. UTI testicular torsion hydrocele. Obstructing stone noted on ct. Wbc 16 no urologist on call. Plan to transfer  no urologist at  [facility] nor [facility].  Discussed [facility] transfer center. Dr. [place] mosby accepted auto accept urology at [place]. Pt states pain is better but not gone .  Clinical Impression   Right kidney stone - with obstruction  (Primary)   Calculus of other lower urinary tract location   Leukocytosis, unspecified type       Disposition: Transfered to Another Facility

## 2023-11-16 NOTE — ED Nurses Note (Signed)
 Pt transported to [facility] ED by [facility] at this time.

## 2023-11-16 NOTE — ED Nurses Note (Signed)
 [employer] to ED for pt transport at this time.

## 2023-11-16 NOTE — ED Nurses Note (Signed)
 Nursing report called to Abigail RN at [facility]

## 2023-12-02 ENCOUNTER — Other Ambulatory Visit (HOSPITAL_COMMUNITY): Payer: Self-pay | Admitting: INTERNAL MEDICINE

## 2023-12-02 DIAGNOSIS — I209 Angina pectoris, unspecified: Secondary | ICD-10-CM

## 2023-12-02 DIAGNOSIS — R0602 Shortness of breath: Secondary | ICD-10-CM

## 2023-12-03 ENCOUNTER — Encounter (HOSPITAL_COMMUNITY): Payer: Self-pay

## 2023-12-25 ENCOUNTER — Ambulatory Visit

## 2024-01-14 ENCOUNTER — Other Ambulatory Visit: Payer: Self-pay

## 2024-01-14 ENCOUNTER — Ambulatory Visit
Admission: RE | Admit: 2024-01-14 | Discharge: 2024-01-14 | Disposition: A | Discharge status: Home / Self Care | Payer: Self-pay | Source: Ambulatory Visit | Attending: INTERNAL MEDICINE | Admitting: INTERNAL MEDICINE

## 2024-01-14 ENCOUNTER — Ambulatory Visit (HOSPITAL_COMMUNITY)

## 2024-01-14 DIAGNOSIS — I209 Angina pectoris, unspecified: Secondary | ICD-10-CM | POA: Insufficient documentation

## 2024-01-14 DIAGNOSIS — R0602 Shortness of breath: Secondary | ICD-10-CM | POA: Insufficient documentation

## 2024-01-14 LAB — CREATININE WITH EGFR
CREATININE: 0.99 mg/dL (ref 0.60–1.30)
ESTIMATED GFR: 96 mL/min/1.73mˆ2 (ref >59)

## 2024-01-14 MED ORDER — ASPIRIN 81 MG CHEWABLE TABLET
CHEWABLE_TABLET | ORAL | Status: AC
Start: 2024-01-14 — End: 2024-01-14
  Filled 2024-01-14: qty 4

## 2024-01-14 MED ORDER — METOPROLOL TARTRATE 50 MG TABLET
ORAL_TABLET | ORAL | Status: AC
Start: 2024-01-14 — End: 2024-01-14
  Filled 2024-01-14: qty 1

## 2024-01-14 MED ORDER — DIAZEPAM 5 MG TABLET
ORAL_TABLET | ORAL | Status: AC
Start: 2024-01-14 — End: 2024-01-14
  Filled 2024-01-14: qty 1

## 2024-01-14 MED ORDER — IVABRADINE 5 MG TABLET
ORAL_TABLET | ORAL | Status: AC
Start: 2024-01-14 — End: 2024-01-14
  Filled 2024-01-14: qty 3

## 2024-01-14 MED ORDER — METOPROLOL TARTRATE 50 MG TABLET
50.0000 mg | ORAL_TABLET | Freq: Once | ORAL | Status: AC
Start: 2024-01-14 — End: 2024-01-14
  Administered 2024-01-14: 50 mg via ORAL

## 2024-01-14 MED ORDER — DIPHENHYDRAMINE 25 MG CAPSULE
ORAL_CAPSULE | ORAL | Status: AC
Start: 2024-01-14 — End: 2024-01-14
  Filled 2024-01-14: qty 1

## 2024-01-14 MED ORDER — NITROGLYCERIN 0.4 MG SUBLINGUAL TABLET
SUBLINGUAL_TABLET | SUBLINGUAL | Status: AC
Start: 2024-01-14 — End: 2024-01-14
  Filled 2024-01-14: qty 1

## 2024-01-14 MED ORDER — NITROGLYCERIN 0.4 MG SUBLINGUAL TABLET
0.4000 mg | SUBLINGUAL_TABLET | Freq: Once | SUBLINGUAL | Status: DC
Start: 2024-01-14 — End: 2024-01-15

## 2024-01-14 MED ORDER — IVABRADINE 5 MG TABLET
15.0000 mg | ORAL_TABLET | Freq: Once | ORAL | Status: AC
Start: 2024-01-14 — End: 2024-01-14
  Administered 2024-01-14: 15 mg via ORAL

## 2024-01-14 NOTE — Nurses Notes (Signed)
@   0753  HR 100, BP- 131/91  Ivabradine  15 mg po and Metoprolol  tartrate 50 mg po given.     @ 0930 HR remains in 90's, Dr Rivka notified, CTA cancelled.    @ 1000 BP and HR remain stable, Pt discaharged to home.

## 2024-01-23 ENCOUNTER — Other Ambulatory Visit: Payer: Self-pay

## 2024-01-23 ENCOUNTER — Encounter (HOSPITAL_COMMUNITY): Payer: Self-pay | Admitting: Emergency Medicine

## 2024-01-23 ENCOUNTER — Observation Stay: Admission: EM | Admit: 2024-01-23 | Discharge: 2024-01-26 | Disposition: A | Discharge status: Home / Self Care

## 2024-01-23 ENCOUNTER — Other Ambulatory Visit (HOSPITAL_COMMUNITY): Payer: Self-pay

## 2024-01-23 ENCOUNTER — Emergency Department (HOSPITAL_COMMUNITY)

## 2024-01-23 ENCOUNTER — Observation Stay (HOSPITAL_COMMUNITY)

## 2024-01-23 DIAGNOSIS — M7989 Other specified soft tissue disorders: Secondary | ICD-10-CM

## 2024-01-23 DIAGNOSIS — E11610 Type 2 diabetes mellitus with diabetic neuropathic arthropathy: Secondary | ICD-10-CM | POA: Insufficient documentation

## 2024-01-23 DIAGNOSIS — E66812 Obesity, class 2: Secondary | ICD-10-CM | POA: Insufficient documentation

## 2024-01-23 DIAGNOSIS — M25572 Pain in left ankle and joints of left foot: Secondary | ICD-10-CM

## 2024-01-23 DIAGNOSIS — E1169 Type 2 diabetes mellitus with other specified complication: Secondary | ICD-10-CM | POA: Insufficient documentation

## 2024-01-23 DIAGNOSIS — E11621 Type 2 diabetes mellitus with foot ulcer: Secondary | ICD-10-CM

## 2024-01-23 DIAGNOSIS — I10 Essential (primary) hypertension: Secondary | ICD-10-CM

## 2024-01-23 DIAGNOSIS — M898X7 Other specified disorders of bone, ankle and foot: Secondary | ICD-10-CM

## 2024-01-23 DIAGNOSIS — Z7984 Long term (current) use of oral hypoglycemic drugs: Secondary | ICD-10-CM | POA: Insufficient documentation

## 2024-01-23 DIAGNOSIS — E1142 Type 2 diabetes mellitus with diabetic polyneuropathy: Secondary | ICD-10-CM | POA: Insufficient documentation

## 2024-01-23 DIAGNOSIS — Z87891 Personal history of nicotine dependence: Secondary | ICD-10-CM | POA: Insufficient documentation

## 2024-01-23 DIAGNOSIS — L97529 Non-pressure chronic ulcer of other part of left foot with unspecified severity: Secondary | ICD-10-CM

## 2024-01-23 DIAGNOSIS — Z79899 Other long term (current) drug therapy: Secondary | ICD-10-CM | POA: Insufficient documentation

## 2024-01-23 DIAGNOSIS — L0390 Cellulitis, unspecified: Secondary | ICD-10-CM | POA: Diagnosis present

## 2024-01-23 DIAGNOSIS — Z7985 Long-term (current) use of injectable non-insulin antidiabetic drugs: Secondary | ICD-10-CM | POA: Insufficient documentation

## 2024-01-23 DIAGNOSIS — Z794 Long term (current) use of insulin: Secondary | ICD-10-CM | POA: Insufficient documentation

## 2024-01-23 DIAGNOSIS — M869 Osteomyelitis, unspecified: Secondary | ICD-10-CM

## 2024-01-23 DIAGNOSIS — Z6834 Body mass index (BMI) 34.0-34.9, adult: Secondary | ICD-10-CM | POA: Insufficient documentation

## 2024-01-23 DIAGNOSIS — M79662 Pain in left lower leg: Secondary | ICD-10-CM

## 2024-01-23 DIAGNOSIS — E785 Hyperlipidemia, unspecified: Secondary | ICD-10-CM | POA: Insufficient documentation

## 2024-01-23 DIAGNOSIS — L03116 Cellulitis of left lower limb: Principal | ICD-10-CM | POA: Insufficient documentation

## 2024-01-23 DIAGNOSIS — M868X7 Other osteomyelitis, ankle and foot: Secondary | ICD-10-CM | POA: Insufficient documentation

## 2024-01-23 DIAGNOSIS — Z89412 Acquired absence of left great toe: Secondary | ICD-10-CM

## 2024-01-23 MED ORDER — DEXTROSE 50 % IN WATER (D50W) INTRAVENOUS SYRINGE
12.5000 g | INJECTION | INTRAVENOUS | Status: DC | PRN
Start: 2024-01-23 — End: 2024-01-26

## 2024-01-23 MED ORDER — ACETAMINOPHEN 325 MG TABLET
650.0000 mg | ORAL_TABLET | ORAL | Status: DC | PRN
Start: 2024-01-23 — End: 2024-01-26
  Administered 2024-01-24: 650 mg via ORAL
  Filled 2024-01-23: qty 2

## 2024-01-23 MED ORDER — MONTELUKAST 10 MG TABLET
10.0000 mg | ORAL_TABLET | Freq: Every evening | ORAL | Status: DC
Start: 2024-01-23 — End: 2024-01-24
  Administered 2024-01-23: 0 mg via ORAL

## 2024-01-23 MED ORDER — MULTIVITAMIN WITH FOLIC ACID 400 MCG TABLET
1.0000 | ORAL_TABLET | Freq: Every day | ORAL | Status: DC
Start: 2024-01-24 — End: 2024-01-26
  Administered 2024-01-24 – 2024-01-26 (×3): 1 via ORAL
  Filled 2024-01-23 (×3): qty 1

## 2024-01-23 MED ORDER — LISINOPRIL 5 MG TABLET
10.0000 mg | ORAL_TABLET | Freq: Every day | ORAL | Status: DC
Start: 2024-01-24 — End: 2024-01-26
  Administered 2024-01-24: 0 mg via ORAL
  Administered 2024-01-25: 10 mg via ORAL
  Administered 2024-01-26: 0 mg via ORAL
  Filled 2024-01-23 (×3): qty 2

## 2024-01-23 MED ORDER — ONDANSETRON HCL (PF) 4 MG/2 ML INJECTION SOLUTION
4.0000 mg | Freq: Four times a day (QID) | INTRAMUSCULAR | Status: DC | PRN
Start: 2024-01-23 — End: 2024-01-26

## 2024-01-23 MED ORDER — OXYCODONE 5 MG TABLET
5.0000 mg | ORAL_TABLET | ORAL | Status: DC | PRN
Start: 2024-01-23 — End: 2024-01-26

## 2024-01-23 MED ORDER — VANCOMYCIN 10 GRAM INTRAVENOUS SOLUTION
20.0000 mg/kg | INTRAVENOUS | Status: AC
Start: 2024-01-23 — End: 2024-01-24
  Administered 2024-01-23: 2250 mg via INTRAVENOUS
  Administered 2024-01-24: 0 mg via INTRAVENOUS
  Filled 2024-01-23: qty 22.5

## 2024-01-23 MED ORDER — ATORVASTATIN 40 MG TABLET
80.0000 mg | ORAL_TABLET | Freq: Every day | ORAL | Status: DC
Start: 2024-01-24 — End: 2024-01-26
  Administered 2024-01-24 – 2024-01-26 (×3): 80 mg via ORAL
  Filled 2024-01-23 (×3): qty 2

## 2024-01-23 MED ORDER — DOCUSATE SODIUM 100 MG CAPSULE
100.0000 mg | ORAL_CAPSULE | Freq: Two times a day (BID) | ORAL | Status: DC | PRN
Start: 2024-01-23 — End: 2024-01-26

## 2024-01-23 MED ORDER — ALUMINUM-MAG HYDROXIDE-SIMETHICONE 200 MG-200 MG-20 MG/5 ML ORAL SUSP
30.0000 mL | ORAL | Status: DC | PRN
Start: 2024-01-23 — End: 2024-01-26

## 2024-01-23 MED ORDER — DEXTROSE 40 % ORAL GEL
15.0000 g | ORAL | Status: DC | PRN
Start: 2024-01-23 — End: 2024-01-26

## 2024-01-23 MED ORDER — INSULIN LISPRO 100 UNIT/ML SUB-Q SSIP VIAL
3.0000 [IU] | INJECTION | Freq: Four times a day (QID) | SUBCUTANEOUS | Status: DC
Start: 2024-01-23 — End: 2024-01-26
  Administered 2024-01-23 – 2024-01-25 (×9): 0 [IU] via SUBCUTANEOUS
  Administered 2024-01-26: 3 [IU] via SUBCUTANEOUS
  Administered 2024-01-26: 0 [IU] via SUBCUTANEOUS
  Filled 2024-01-23: qty 3

## 2024-01-23 MED ORDER — IOPAMIDOL 370 MG IODINE/ML (76 %) INTRAVENOUS SOLUTION
75.0000 mL | INTRAVENOUS | Status: AC
Start: 2024-01-23 — End: 2024-01-23
  Administered 2024-01-23: 75 mL via INTRAVENOUS

## 2024-01-23 MED ORDER — ENOXAPARIN 40 MG/0.4 ML SUBCUTANEOUS SYRINGE
40.0000 mg | INJECTION | Freq: Every day | SUBCUTANEOUS | Status: DC
Start: 2024-01-24 — End: 2024-01-26
  Administered 2024-01-24 – 2024-01-26 (×3): 40 mg via SUBCUTANEOUS
  Filled 2024-01-23 (×3): qty 0.4

## 2024-01-23 MED ORDER — GLUCAGON HCL 1 MG/ML SOLUTION FOR INJECTION
1.0000 mg | Freq: Once | INTRAMUSCULAR | Status: DC | PRN
Start: 2024-01-23 — End: 2024-01-26

## 2024-01-23 MED ORDER — METOPROLOL SUCCINATE ER 50 MG TABLET,EXTENDED RELEASE 24 HR
100.0000 mg | ORAL_TABLET | Freq: Every day | ORAL | Status: DC
Start: 2024-01-24 — End: 2024-01-26
  Administered 2024-01-24 – 2024-01-26 (×3): 100 mg via ORAL
  Filled 2024-01-23 (×3): qty 2

## 2024-01-23 MED ORDER — FOLIC ACID 1 MG TABLET
1.0000 mg | ORAL_TABLET | Freq: Every day | ORAL | Status: DC
Start: 2024-01-24 — End: 2024-01-26
  Administered 2024-01-24 – 2024-01-26 (×3): 1 mg via ORAL
  Filled 2024-01-23 (×3): qty 1

## 2024-01-23 NOTE — H&P (Signed)
 [facility]   [facility]     HOSPITALIST H&P    Justin [name], Justin [name], 48 y.o. male Date of Service: 01/23/2024       Date of Birth: 1975-08-12  Medical Record Number: [id number]  Date of Admission:  01/23/2024  Inpatient Admission Date:    Attending: Tonie Baconton, DO   Service: [facility] EMERGENCY  PCP: Harlene DELENA Danger, FNP ED04/ED04  Code: No Order  LOS 0        CHIEF COMPLAINT:   Ankle Pain    HPI:   Justin [name]. 48 y.o. male        Patient states that over the past 2 weeks he noticed worsening pain in the left ankle.  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  Challenges with walking, some pain in the ankle and also up the lateral posterior portion of his left calf but stopping below-the-knee Denies any fevers denies nausea vomiting diarrhea, denies any blood in his urine or stool denies any dysuria.  Decided to get evaluated at urgent care.  They were concerned and sent him here to the ER.  Imaging concerning for potential osteomyelitis.                                                       ROS: - :  Ros - complete: Other than ROS in the HPI, all other systems were negative.  HPI Information Obtained from: patient and spouse    ED MEDICATIONS:   Medications Ordered/Administered in the ED   vancomycin  (VANCOCIN ) 2,250 mg in NS 500 mL IVPB (has no administration in time range)   iopamidol  (ISOVUE -370) 76% infusion (75 mL Intravenous Given 01/23/24 2058)          MEDICAL HISTORY:     Past Medical History:   Diagnosis Date    Amputated great toe     Diabetes mellitus, type 2     HLD (hyperlipidemia)     Hypertension     Intestinal hemorrhage     OCD (obsessive compulsive disorder)     Osgood-Schlatter's disease     Perianal abscess     Torsion of epididymis      Past Surgical History:   Procedure Laterality Date    COLONOSCOPY      COLONOSCOPY N/A 09/18/2022    Performed by Girard Justin RAMAN, MD at [facility]    CYSTECTOMY      HIP SURGERY      HX APPENDECTOMY      LAPOROTOMY     SURGERY SCROTAL / TESTICULAR      TOE AMPUTATION Left     great toe     Social History[1]   reports that he has quit smoking. His smoking use included cigarettes. He has never used smokeless tobacco. He reports current alcohol use. He reports that he does not use drugs.    E-Cigarettes/Vaping    E-Cigarette/Vaping Use Never User        Family Medical History:    None          --      ALLERGIES:    Allergies[2]      MEDICATIONS  Prior to Admission medications    Medication Sig Start Date End Date Taking? Authorizing Provider   acetaminophen  (  TYLENOL ) 325 mg Oral Tablet Take 1 Tablet (325 mg total) by mouth Every 4 hours as needed for Pain    Provider, Historical   atorvastatin  (LIPITOR) 20 mg Oral Tablet Take 4 Tablets (80 mg total) by mouth Daily    Provider, Historical   Bacillus coagulans/inulin (PROBICHEW ORAL) Take by mouth    Provider, Historical   HUMALOG  KWIKPEN INSULIN  100 unit/mL Subcutaneous Insulin  Pen ADMINISTER UP TO 20 UNITS UNDER THE SKIN THREE TIMES DAILY BEFORE MEALS 06/17/20   Provider, Historical   IBU 800 mg Oral Tablet     Provider, Historical   lisinopriL  (PRINIVIL ) 10 mg Oral Tablet Take 1 Tablet (10 mg total) by mouth Daily    Provider, Historical   MEN'S MULTI-VITAMIN ORAL Take by mouth    Provider, Historical   metFORMIN (GLUCOPHAGE XR) 500 mg Oral Tablet Sustained Release 24 hr 2 Tablets (1,000 mg total) Daily    Provider, Historical   metoprolol  succinate (TOPROL -XL) 100 mg Oral Tablet Sustained Release 24 hr Once a day    Provider, Historical   montelukast  (SINGULAIR ) 10 mg Oral Tablet     Provider, Historical   tirzepatide (MOUNJARO) 12.5 mg/0.5 mL Subcutaneous Pen Injector     Provider, Historical   TOUJEO MAX U-300 SOLOSTAR 300 unit/mL (3 mL) Subcutaneous Insulin  Pen 100 units sc daily 04/19/20   Provider, Historical     Medications Prior to Admission       Prescriptions    acetaminophen  (TYLENOL ) 325 mg Oral Tablet    Take 1 Tablet (325 mg total) by mouth Every 4 hours as needed for  Pain    atorvastatin  (LIPITOR) 20 mg Oral Tablet    Take 4 Tablets (80 mg total) by mouth Daily    Bacillus coagulans/inulin (PROBICHEW ORAL)    Take by mouth    HUMALOG  KWIKPEN INSULIN  100 unit/mL Subcutaneous Insulin  Pen    ADMINISTER UP TO 20 UNITS UNDER THE SKIN THREE TIMES DAILY BEFORE MEALS    IBU 800 mg Oral Tablet    lisinopriL  (PRINIVIL ) 10 mg Oral Tablet    Take 1 Tablet (10 mg total) by mouth Daily    MEN'S MULTI-VITAMIN ORAL    Take by mouth    metFORMIN (GLUCOPHAGE XR) 500 mg Oral Tablet Sustained Release 24 hr    2 Tablets (1,000 mg total) Daily    metoprolol  succinate (TOPROL -XL) 100 mg Oral Tablet Sustained Release 24 hr    Once a day    montelukast  (SINGULAIR ) 10 mg Oral Tablet    tirzepatide (MOUNJARO) 12.5 mg/0.5 mL Subcutaneous Pen Injector    TOUJEO MAX U-300 SOLOSTAR 300 unit/mL (3 mL) Subcutaneous Insulin  Pen    100 units sc daily                   PHYSICAL EXAM:  Filed Vitals:    01/23/24 1929   BP: 118/86   Pulse: (!) 102   Resp: 19   Temp: 36.2 C (97.2 F)   SpO2: 97%           VITALS:    BP 118/86   Pulse (!) 102   Temp 36.2 C (97.2 F)   Resp 19   Ht 1.778 m (5' 10)   Wt 111 kg (245 lb)   SpO2 97%   BMI 35.15 kg/m     Physical Exam  Constitutional:       Appearance: Normal appearance. He is obese. He is not ill-appearing.   HENT:  Head: Normocephalic and atraumatic.   Eyes:      Extraocular Movements: Extraocular movements intact.      Pupils: Pupils are equal, round, and reactive to light.   Cardiovascular:      Rate and Rhythm: Normal rate and regular rhythm.      Heart sounds: Normal heart sounds. No murmur heard.  Pulmonary:      Effort: Pulmonary effort is normal. No respiratory distress.   Abdominal:      General: There is no distension.      Palpations: Abdomen is soft.      Tenderness: There is no abdominal tenderness.   Musculoskeletal:         General: No tenderness or signs of injury.      Right lower leg: Edema present.      Left lower leg: Edema present.       Comments: Compression socks in place orthotic shoes in place   Neurological:      General: No focal deficit present.      Mental Status: He is alert and oriented to person, place, and time.      Cranial Nerves: No cranial nerve deficit.      Motor: No weakness.                  DIAGNOSTIC STUDIES:     Recent Labs     01/23/24  1955   WBC 13.2*   HGB 11.7*   HCT 35.2*   PLTCNT 399     Recent Labs     01/23/24  1955   PMNS 55   LYMPHOCYTES 31   MONOCYTES 12   EOSINOPHIL 2   BASOPHILS 1  0.10   PMNABS 7.30   LYMPHSABS 4.10*   EOSABS 0.20   MONOSABS 1.50*     Recent Labs     01/23/24  1955   SODIUM 138   POTASSIUM 3.7   CHLORIDE 105   CO2 25   BUN 25   CREATININE 1.07   GLUCOSENF 66*   ANIONGAP 8   BUNCRRATIO 23*   GFR 87     Recent Labs     01/23/24  1955   AST 17   ALT 16   ALKPHOS 83   TOTBILIRUBIN 0.7   TOTALPROTEIN 7.3   ALBUMIN 4.1     No results for input(s): PREALBUMIN, LIPASE, UROBILINOGEN, GAMMAGT, LDH, AMYLASE, AMMONIA in the last 72 hours.  No results for input(s): PROTHROMTME, INR, APTT, DDIMERQUANT, FIBRINOGEN in the last 72 hours.  No results for input(s): CREAPROINFLA, ESR in the last 72 hours.        Results for orders placed or performed during the hospital encounter of 01/23/24 (from the past 24 hours)   COMPREHENSIVE METABOLIC PANEL, NON-FASTING   Result Value Ref Range    SODIUM 138 136 - 145 mmol/L    POTASSIUM 3.7 3.5 - 5.1 mmol/L    CHLORIDE 105 98 - 107 mmol/L    CO2 TOTAL 25 21 - 31 mmol/L    ANION GAP 8 4 - 13 mmol/L    BUN 25 7 - 25 mg/dL    CREATININE 8.92 9.39 - 1.30 mg/dL    BUN/CREA RATIO 23 (H) 6 - 22    ESTIMATED GFR 87 >59 mL/min/1.92m^2    ALBUMIN 4.1 3.5 - 5.7 g/dL    CALCIUM 89.9 8.6 - 89.6 mg/dL    GLUCOSE 66 (L) 74 - 109 mg/dL    ALKALINE PHOSPHATASE 83 34 -  104 U/L    ALT (SGPT) 16 7 - 52 U/L    AST (SGOT) 17 13 - 39 U/L    BILIRUBIN TOTAL 0.7 0.3 - 1.0 mg/dL    PROTEIN TOTAL 7.3 6.4 - 8.9 g/dL    ALBUMIN/GLOBULIN RATIO 1.3 0.8 - 1.4    OSMOLALITY,  CALCULATED 278 270 - 290 mOsm/kg    CALCIUM, CORRECTED 9.9 8.9 - 10.8 mg/dL    GLOBULIN 3.2 2.0 - 3.5   SEDIMENTATION RATE   Result Value Ref Range    ERYTHROCYTE SEDIMENTATION RATE (ESR) 45 (H) <15 mm/hr   C-REACTIVE PROTEIN (CRP)   Result Value Ref Range    C-REACTIVE PROTEIN (CRP) 3.0 (H) 0.1 - 0.5 mg/dL   CBC WITH DIFF   Result Value Ref Range    WBC 13.2 (H) 3.6 - 10.2 x10^3/uL    RBC 4.15 4.06 - 5.63 x10^6/uL    HGB 11.7 (L) 12.5 - 16.3 g/dL    HCT 64.7 (L) 63.2 - 47.1 %    MCV 84.8 73.0 - 96.2 fL    MCH 28.2 23.8 - 33.4 pg    MCHC 33.2 32.5 - 36.3 g/dL    RDW 85.7 87.8 - 83.7 %    PLATELETS 399 140 - 440 x10^3/uL    MPV 7.2 (L) 7.4 - 11.4 fL    NEUTROPHIL % 55 44 - 74 %    LYMPHOCYTE % 31 15 - 43 %    MONOCYTE % 12 6 - 14 %    EOSINOPHIL % 2 1 - 8 %    BASOPHIL % 1 0 - 1 %    NEUTROPHIL # 7.30 1.85 - 7.84 x10^3/uL    LYMPHOCYTE # 4.10 (H) 1.00 - 3.00 x10^3/uL    MONOCYTE # 1.50 (H) 0.30 - 1.10 x10^3/uL    EOSINOPHIL # 0.20 0.00 - 0.60 x10^3/uL    BASOPHIL # 0.10 0.00 - 0.10 x10^3/uL   POC BLOOD GLUCOSE (RESULTS)   Result Value Ref Range    GLUCOSE, POC 40 (LL) 70 - 100 mg/dl                      Imaging Studies:    Results for orders placed or performed during the hospital encounter of 01/23/24   CT EXTREMITY LOWER LEFT W IV CONTRAST     Status: None    Narrative    [name] Name JR.    RADIOLOGIST: Lynwood Shoemaker    CT EXTREMITY LOWER LEFT W IV CONTRAST performed on 01/23/2024 8:57 PM    CLINICAL HISTORY: Pain/swelling left foot and ankle, prior amputation great toe, diabetic.  Pain/swelling left foot and ankle, prior amputation great toe, diabetic    TECHNIQUE:  Left lower leg and foot CT with intravenous contrast.    CONTRAST:  75 ml's of Isovue  370    COMPARISON: None.    FINDINGS:  Bones: Previous first toe amputation. Destructive changes of the distal second metatarsal and distal third metatarsal and proximal phalanx of the third toe. Fragmentation in the area of the proximal second and third  metatarsals. No other acute fracture or bone destruction or periosteal reaction.    Joints: Joint space(s) preserved.  No subluxation or dislocation.    Soft Tissues: Diffuse soft tissue swelling of the foot. This could BE due to cellulitis. There is no evidence of abscess. There is no abnormal air in the soft tissue.        Impression  Destructive changes of the distal second and third metatarsals and proximal phalanx of the third toe. This could be due to osteomyelitis.    Fragmentation in the midfoot to the due to healing fractures and/or neuropathic joints.    Diffuse soft tissue swelling of the foot could be due to cellulitis. There is no evidence of abscess.          One or more dose reduction techniques were used (e.g., Automated exposure control, adjustment of the mA and/or kV according to patient size, use of iterative reconstruction technique).      Radiologist location ID: [id number]                      ASSESSMENT AND PLAN:  Justin [name], [place]., 48 y.o. male      IPRX  No outpatient medications have been marked as taking for the 01/23/24 encounter [facility] Encounter).      vancomycin  (VANCOCIN ) 2,250 mg in NS 500 mL IVPB, 20 mg/kg, Intravenous, Now           Problem List  Active Hospital Problems   (*Primary Problem)    Diagnosis    Cellulitis     ---------------------      ---------------  Problem List[3]         AP  Diabetic foot wound - patient's echo pain has been getting worse over the past few weeks.  He has also noticed increased swelling.  Decreased weight-bearing tolerance.  Evaluated in outpatient urgent care and told there may be a chance of osteomyelitis.  Sent to ER for further evaluation.  CT imaging saying the same, MRI ordered.  Place on vancomycin .  Blood cultures obtained.  Diabetes - continue sliding scale.  Hold oral medications for now.  HTN - continue most of his home medications.  Allergies  - patient takes Singulair , usually takes it seasonally has not been taking it over the  past several days.             DIET: No diet orders on file  DVT PROPHYLAXIS:   CODE/DNR STATUS:    No Order         Disposition:    Based on estimated stay less or more than 48 hours based on this to obtain full medical treatment the current status of this visit on admission  []  Observation Status  []  Admission Inpatient    The patient is currently acutely ill requiring treatment on the medical floor for    <principal problem not specified>    <principal problem not specified>  No admission diagnoses are documented for this encounter.         Patient will be closely evaluated monitor and remove be adjusted accordingly.          Janie Landsman, MD      MEDICINE HOSPITALIST     [facility]     Contents of the document, in whole or in part, are completed utilizing voice dictation technology, please forgive any typographical errors that may exist.                         [1]   Social History  Tobacco Use    Smoking status: Former     Types: Cigarettes    Smokeless tobacco: Never    Tobacco comments:     QUIT 4 YRS AGO   Vaping Use    Vaping status: Never Used   Substance Use  Topics    Alcohol use: Yes     Comment: SOCIAL    Drug use: Never   [2] No Known Allergies  [3]   Patient Active Problem List  Diagnosis    Cellulitis

## 2024-01-23 NOTE — ED Provider Notes (Signed)
 [facility]  ED Primary Provider Note        Arrival: The patient arrived by Car     History of Present Illness   chief complaint  Justin [name]. is a 48 y.o. male who had concerns including Ankle Pain.  Patient 48 year old male who presents emergency room with a chief complaint of pain and swelling to the left foot and ankle x2 weeks.  Patient a 7/10.  Worse with the ambulation.  Patient is a type 2 diabetic.  Prior amputation of the left great toe.  Patient did go to [facility] that has evening had plain films done.  Reportedly had some breakdown of bone consistent with a possible osteomyelitis as well as swelling of tissue consistent with a cellulitis.  Patient denying any fever.  Denying any chest pain or shortness of breath.  Does have neuropathy.  Blood pressure 118/86 with a heart rate of 102, respiratory rate of 19 with a oxygen saturation 97% on room air.  Afebrile at 97.2  All nursing notes reviewed    History of Present Illness      Review of Systems     No other overt Review of Systems are noted to be positive except noted in the HPI.      Historical Data   History Reviewed This Encounter: Medical History  Surgical History  Family History  Social History      Physical Exam   ED Triage Vitals   BP (Non-Invasive) 01/23/24 1929 118/86   Heart Rate 01/23/24 1929 (!) 102   Respiratory Rate 01/23/24 1929 19   Temperature 01/23/24 1929 36.2 C (97.2 F)   SpO2 01/23/24 1929 97 %   Weight 01/23/24 2134 111 kg (245 lb)   Height 01/23/24 2134 1.778 m (5' 10)         Exam:   Constitutional:  Patient alert orient x3 in no apparent distress.  No limitations.  Head: Atraumatic normocephalic  Eyes :  Pupils are equal round reactive to light and accommodation extraocular muscles are intact.  Sclera and conjunctiva are unremarkable  Ears:  Tympanic membranes are pearly gray bilaterally; external auditory canals are unremarkable; external ears without any lesions  Nose:  Nares are  patent turbinates are pink and moist  Mouth:  Mucosa is pink and moist without lesions.  Posterior pharynx is pink and moist without hypertrophy/exudate.  Neck:  Soft and supple without palpable lymphadenopathy.  Heart:  Regular rate and rhythm without audible murmur  Lungs:  Clear to auscultation bilaterally without any wheezing/rales/rhonchi  Abdomen:  Soft nontender without any rebound or guarding; positive bowel sounds throughout  Genitalia:  Deferred  Skin:  Redness and swelling to the left foot primarily over the dorsal aspect of the foot   Extremities:  Tenderness over the medial aspect of the left ankle.  Redness and swelling of the dorsal aspect of the left foot.  Prior amputation of the great toe.  Neuro:  Alert oriented x3.  Cranial nerves II-XII grossly intact as tested.  Chronic decreased sensation of feet secondary to neuropathy.    Psychiatric:  Patient cooperative, affect appropriate, insight and judgment good          Procedures           Patient Data     Labs Ordered/Reviewed   COMPREHENSIVE METABOLIC PANEL, NON-FASTING - Abnormal; Notable for the following components:       Result Value    BUN/CREA RATIO 23 (*)  GLUCOSE 66 (*)     All other components within normal limits    Narrative:     Estimated Glomerular Filtration Rate (eGFR) is calculated using the CKD-EPI (2021) equation, intended for patients 66 years of age and older. If gender is not documented or unknown, there will be no eGFR calculation.     SEDIMENTATION RATE - Abnormal; Notable for the following components:    ERYTHROCYTE SEDIMENTATION RATE (ESR) 45 (*)     All other components within normal limits   C-REACTIVE PROTEIN (CRP) - Abnormal; Notable for the following components:    C-REACTIVE PROTEIN (CRP) 3.0 (*)     All other components within normal limits   CBC WITH DIFF - Abnormal; Notable for the following components:    WBC 13.2 (*)     HGB 11.7 (*)     HCT 35.2 (*)     MPV 7.2 (*)     LYMPHOCYTE # 4.10 (*)     MONOCYTE #  1.50 (*)     All other components within normal limits   URINALYSIS, MACROSCOPIC - Abnormal; Notable for the following components:    SPECIFIC GRAVITY 1.038 (*)     All other components within normal limits   POC BLOOD GLUCOSE (RESULTS) - Abnormal; Notable for the following components:    GLUCOSE, POC 40 (*)     All other components within normal limits   POC BLOOD GLUCOSE (RESULTS) - Abnormal; Notable for the following components:    GLUCOSE, POC 51 (*)     All other components within normal limits   POC BLOOD GLUCOSE (RESULTS) - Abnormal; Notable for the following components:    GLUCOSE, POC 121 (*)     All other components within normal limits   LACTIC ACID LEVEL W/ REFLEX FOR LEVEL >2.0 - Normal   URINALYSIS, MICROSCOPIC - Normal   ADULT ROUTINE BLOOD CULTURE, SET OF 2 BOTTLES (BACTERIA AND YEAST)   ADULT ROUTINE BLOOD CULTURE, SET OF 2 BOTTLES (BACTERIA AND YEAST)   CBC/DIFF    Narrative:     The following orders were created for panel order CBC/DIFF.  Procedure                               Abnormality         Status                     ---------                               -----------         ------                     CBC WITH IPQQ[[id number]]                Abnormal            Final result                 Please view results for these tests on the individual orders.   EXTRA TUBES    Narrative:     The following orders were created for panel order EXTRA TUBES.  Procedure  Abnormality         Status                     ---------                               -----------         ------                     BLUE TOP ULAZ[[id number]]                                    Final result               GOLD TOP ULAZ[[id number]]                                    Final result               GOLD TOP ULAZ[[id number]]                                    Final result               GRAY TOP ULAZ[[id number]]                                    Final result                 Please view results for these  tests on the individual orders.   BLUE TOP TUBE   GOLD TOP TUBE   GOLD TOP TUBE   GRAY TOP TUBE   URINALYSIS, MACROSCOPIC AND MICROSCOPIC W/CULTURE REFLEX    Narrative:     The following orders were created for panel order URINALYSIS, MACROSCOPIC AND MICROSCOPIC W/CULTURE REFLEX [PR[facility] ONLY].  Procedure                               Abnormality         Status                     ---------                               -----------         ------                     URINALYSIS, MACROSCOPIC[[id number]]      Abnormal            Final result               URINALYSIS, MICROSCOPIC[[id number]]      Normal              Final result                 Please view results for these tests on the individual orders.   CBC/DIFF    Narrative:     The following orders were created for panel order CBC/DIFF.  Procedure                               Abnormality         Status                     ---------                               -----------         ------                     CBC WITH IPQQ[[id number]]                                    In process                   Please view results for these tests on the individual orders.   BASIC METABOLIC PANEL   MAGNESIUM    PHOSPHORUS   HEPATIC FUNCTION PANEL   CBC WITH DIFF   PERFORM POC WHOLE BLOOD GLUCOSE   PERFORM POC WHOLE BLOOD GLUCOSE   PERFORM POC WHOLE BLOOD GLUCOSE   PERFORM POC WHOLE BLOOD GLUCOSE       CT EXTREMITY LOWER LEFT W IV CONTRAST   Final Result by Edi, Radresults In (08/14 2105)   Destructive changes of the distal second and third metatarsals and proximal phalanx of the third toe. This could be due to osteomyelitis.      Fragmentation in the midfoot to the due to healing fractures and/or neuropathic joints.      Diffuse soft tissue swelling of the foot could be due to cellulitis. There is no evidence of abscess.               One or more dose reduction techniques were used (e.g., Automated exposure control, adjustment of the mA and/or kV according to patient size, use of  iterative reconstruction technique).         Radiologist location ID: [id number]             Medical Decision Making          Medical Decision Making  Patient 48 year old male presents emergency room with a chief complaint on patient swelling to left foot and ankle for greater than 1 week.  Has a had increased swelling particularly to the distal portion of the foot.  Patient is type 2 diabetic.  Prior amputation of the left great toe.  Does have neuropathy of the foot.  Went to [facility] and obtained x-ray which did show destructive lesions of the 2nd and 3rd metatarsals.  Patient denying any fever.  White count 13.2 with a hemoglobin 11.7, hematocrit of 35.2, platelets 399.  Glucose 66 otherwise CMP unremarkable.  CRP 3.0.  Patient does have moderate redness and swelling to the dorsal aspect of the left foot.  Again that has prior amputation of the left great toe.  No appreciable drainage.  CT of foot with IV contrast does show destructive changes to the distal 2nd and 3rd metatarsals consistent with a osteomyelitis.  Patient started on vancomycin .  Blood pressure 118/86 with a heart rate of 102, respiratory rate of 19, oxygen saturation 97% on room air.  Afebrile at 97.2  Diagnosis: 1.  Cellulitis left  foot ; 2.  Osteomyelitis left foot; 3.  Neuropathy     Amount and/or Complexity of Data Reviewed  Labs: ordered. Decision-making details documented in ED Course.  Radiology: ordered.    Risk  Prescription drug management.  Decision regarding hospitalization.    Critical Care  Total time providing critical care: 0 minutes        Medical Decision Making      ED Course as of 01/24/24 0527   Thu Jan 23, 2024   2021 White count 13.2 with a hemoglobin 11.7, hematocrit of 35.2, platelets 399.   2031 Glucose 66 otherwise CMP unremarkable.  CRP 3.0   2031 Sedimentation rate 45   2116 CT left lower extremity:      IMPRESSION:  Destructive changes of the distal second and third metatarsals and proximal phalanx of the third  toe. This could be due to osteomyelitis.     Fragmentation in the midfoot to the due to healing fractures and/or neuropathic joints.     Diffuse soft tissue swelling of the foot could be due to cellulitis. There is no evidence of abscess.         Results      Medications Ordered/Administered in the ED   iopamidol  (ISOVUE -370) 76% infusion (75 mL Intravenous Given 01/23/24 2058)   vancomycin  (VANCOCIN ) 2,250 mg in NS 500 mL IVPB (has no administration in time range)       Patient will be admitted to the  service for further workup and management.    Disposition: Admitted                 Clinical Impression   Cellulitis of left foot (Primary)   Osteomyelitis of left foot   Type 2 diabetes mellitus with peripheral neuropathy (CMS HCC)         Current Discharge Medication List          R.A. Kumar Falwell, DO  Department of Emergency Medicine

## 2024-01-23 NOTE — ED Triage Notes (Signed)
 Pt. Complaining of left ankle pain and swelling of foot. Pt. Is diabetic and has had left great toe removed in 2024.

## 2024-01-24 ENCOUNTER — Encounter (HOSPITAL_COMMUNITY): Payer: Self-pay

## 2024-01-24 DIAGNOSIS — R079 Chest pain, unspecified: Secondary | ICD-10-CM

## 2024-01-24 DIAGNOSIS — E11610 Type 2 diabetes mellitus with diabetic neuropathic arthropathy: Secondary | ICD-10-CM

## 2024-01-24 DIAGNOSIS — E785 Hyperlipidemia, unspecified: Secondary | ICD-10-CM

## 2024-01-24 DIAGNOSIS — E669 Obesity, unspecified: Secondary | ICD-10-CM

## 2024-01-24 DIAGNOSIS — E1169 Type 2 diabetes mellitus with other specified complication: Secondary | ICD-10-CM

## 2024-01-24 DIAGNOSIS — L03116 Cellulitis of left lower limb: Principal | ICD-10-CM

## 2024-01-24 DIAGNOSIS — M869 Osteomyelitis, unspecified: Secondary | ICD-10-CM

## 2024-01-24 DIAGNOSIS — Z794 Long term (current) use of insulin: Secondary | ICD-10-CM

## 2024-01-24 DIAGNOSIS — I10 Essential (primary) hypertension: Secondary | ICD-10-CM

## 2024-01-24 DIAGNOSIS — G8929 Other chronic pain: Secondary | ICD-10-CM

## 2024-01-24 DIAGNOSIS — E1142 Type 2 diabetes mellitus with diabetic polyneuropathy: Secondary | ICD-10-CM

## 2024-01-24 DIAGNOSIS — Z6835 Body mass index (BMI) 35.0-35.9, adult: Secondary | ICD-10-CM

## 2024-01-24 LAB — URINALYSIS, MACROSCOPIC
BILIRUBIN: NEGATIVE mg/dL
BLOOD: NEGATIVE mg/dL
GLUCOSE: NEGATIVE mg/dL
KETONES: NEGATIVE mg/dL
LEUKOCYTES: NEGATIVE WBCs/uL
NITRITE: NEGATIVE
PH: 6 (ref 5.0–9.0)
PROTEIN: NEGATIVE mg/dL
SPECIFIC GRAVITY: 1.038 — ABNORMAL HIGH (ref 1.002–1.030)
UROBILINOGEN: NORMAL mg/dL

## 2024-01-24 LAB — POC BLOOD GLUCOSE (RESULTS)
GLUCOSE, POC: 105 mg/dL — ABNORMAL HIGH (ref 70–100)
GLUCOSE, POC: 119 mg/dL — ABNORMAL HIGH (ref 70–100)
GLUCOSE, POC: 121 mg/dL — ABNORMAL HIGH (ref 70–100)
GLUCOSE, POC: 51 mg/dL — ABNORMAL LOW (ref 70–100)
GLUCOSE, POC: 87 mg/dL (ref 70–100)

## 2024-01-24 LAB — CBC WITH DIFF
BASOPHIL #: 0 x10ˆ3/uL (ref 0.00–0.10)
BASOPHIL %: 0 % (ref 0–1)
EOSINOPHIL #: 0.3 x10ˆ3/uL (ref 0.00–0.60)
EOSINOPHIL %: 2 % (ref 1–8)
HCT: 34.8 % — ABNORMAL LOW (ref 36.7–47.1)
HGB: 11.8 g/dL — ABNORMAL LOW (ref 12.5–16.3)
LYMPHOCYTE #: 3.4 x10ˆ3/uL — ABNORMAL HIGH (ref 1.00–3.00)
LYMPHOCYTE %: 28 % (ref 15–43)
MCH: 28.7 pg (ref 23.8–33.4)
MCHC: 33.9 g/dL (ref 32.5–36.3)
MCV: 84.6 fL (ref 73.0–96.2)
MONOCYTE #: 1.1 x10ˆ3/uL (ref 0.30–1.10)
MONOCYTE %: 9 % (ref 6–14)
MPV: 7.2 fL — ABNORMAL LOW (ref 7.4–11.4)
NEUTROPHIL #: 7.5 x10ˆ3/uL (ref 1.85–7.84)
NEUTROPHIL %: 61 % (ref 44–74)
PLATELETS: 397 x10ˆ3/uL (ref 140–440)
RBC: 4.11 x10ˆ6/uL (ref 4.06–5.63)
RDW: 14.1 % (ref 12.1–16.2)
WBC: 12.3 x10ˆ3/uL — ABNORMAL HIGH (ref 3.6–10.2)

## 2024-01-24 LAB — ECG 12 LEAD
Atrial Rate: 98 {beats}/min
Calculated P Axis: 56 degrees
Calculated R Axis: 43 degrees
Calculated T Axis: 21 degrees
PR Interval: 200 ms
QRS Duration: 80 ms
QT Interval: 342 ms
QTC Calculation: 436 ms
Ventricular rate: 98 {beats}/min

## 2024-01-24 LAB — HEPATIC FUNCTION PANEL
ALBUMIN/GLOBULIN RATIO: 1.3 (ref 0.8–1.4)
ALBUMIN: 3.9 g/dL (ref 3.5–5.7)
ALKALINE PHOSPHATASE: 81 U/L (ref 34–104)
ALT (SGPT): 14 U/L (ref 7–52)
AST (SGOT): 14 U/L (ref 13–39)
BILIRUBIN DIRECT: 0.11 md/dL (ref 0.03–0.18)
BILIRUBIN TOTAL: 0.6 mg/dL (ref 0.3–1.0)
BILIRUBIN, INDIRECT: 0.49 mg/dL (ref <=1)
GLOBULIN: 3 (ref 2.0–3.5)
PROTEIN TOTAL: 6.9 g/dL (ref 6.4–8.9)

## 2024-01-24 LAB — PT/INR
INR: 1.3 — ABNORMAL HIGH (ref 0.84–1.10)
PROTHROMBIN TIME: 14.7 s — ABNORMAL HIGH (ref 9.8–12.7)

## 2024-01-24 LAB — BASIC METABOLIC PANEL
ANION GAP: 7 mmol/L (ref 4–13)
BUN/CREA RATIO: 20 (ref 6–22)
BUN: 18 mg/dL (ref 7–25)
CALCIUM: 9.9 mg/dL (ref 8.6–10.3)
CHLORIDE: 107 mmol/L (ref 98–107)
CO2 TOTAL: 24 mmol/L (ref 21–31)
CREATININE: 0.9 mg/dL (ref 0.60–1.30)
ESTIMATED GFR: 107 mL/min/1.73mˆ2 (ref >59)
GLUCOSE: 98 mg/dL (ref 74–109)
OSMOLALITY, CALCULATED: 278 mosm/kg (ref 270–290)
POTASSIUM: 4.1 mmol/L (ref 3.5–5.1)
SODIUM: 138 mmol/L (ref 136–145)

## 2024-01-24 LAB — TROPONIN-I: TROPONIN I: 3 ng/L (ref <20)

## 2024-01-24 LAB — URINALYSIS, MICROSCOPIC
RBCS: <1 /HPF (ref <4)
SQUAMOUS EPITHELIAL: <1 /HPF (ref <28)
WBCS: <1 /HPF (ref <6)

## 2024-01-24 LAB — PTT (PARTIAL THROMBOPLASTIN TIME): APTT: 53.7 s — ABNORMAL HIGH (ref 25.0–38.0)

## 2024-01-24 LAB — MAGNESIUM: MAGNESIUM: 1.7 mg/dL — ABNORMAL LOW (ref 1.9–2.7)

## 2024-01-24 LAB — PHOSPHORUS: PHOSPHORUS: 3.6 mg/dL — ABNORMAL LOW (ref 3.7–7.2)

## 2024-01-24 MED ORDER — SODIUM CHLORIDE 0.9 % INTRAVENOUS SOLUTION
1000.0000 mg | Freq: Once | INTRAVENOUS | Status: AC
Start: 2024-01-24 — End: 2024-01-24
  Administered 2024-01-24: 1000 mg via INTRAVENOUS
  Administered 2024-01-24: 0 mg via INTRAVENOUS
  Filled 2024-01-24: qty 10

## 2024-01-24 MED ORDER — VANCOMYCIN IV - PHARMACIST TO DOSE PER PROTOCOL
Freq: Every day | Status: DC | PRN
Start: 2024-01-24 — End: 2024-01-24

## 2024-01-24 MED ORDER — SODIUM CHLORIDE 0.9 % INTRAVENOUS SOLUTION
20.0000 mmol | Freq: Once | INTRAVENOUS | Status: AC
Start: 2024-01-24 — End: 2024-01-24
  Administered 2024-01-24: 0 mmol via INTRAVENOUS
  Administered 2024-01-24: 20 mmol via INTRAVENOUS
  Filled 2024-01-24: qty 6.67

## 2024-01-24 MED ORDER — SODIUM CHLORIDE 0.9 % INTRAVENOUS SOLUTION
1000.0000 mg | Freq: Every evening | INTRAVENOUS | Status: DC
Start: 2024-01-25 — End: 2024-01-26
  Administered 2024-01-25: 1000 mg via INTRAVENOUS
  Administered 2024-01-25: 0 mg via INTRAVENOUS
  Filled 2024-01-24: qty 10

## 2024-01-24 MED ORDER — VANCOMYCIN 10 GRAM INTRAVENOUS SOLUTION
15.0000 mg/kg | Freq: Two times a day (BID) | INTRAVENOUS | Status: DC
Start: 2024-01-24 — End: 2024-01-24
  Administered 2024-01-24: 1250 mg via INTRAVENOUS
  Administered 2024-01-24: 0 mg via INTRAVENOUS
  Filled 2024-01-24 (×3): qty 12.5

## 2024-01-24 MED ORDER — MAGNESIUM OXIDE 400 MG (241.3 MG MAGNESIUM) TABLET
400.0000 mg | ORAL_TABLET | Freq: Two times a day (BID) | ORAL | Status: DC
Start: 2024-01-24 — End: 2024-01-26
  Administered 2024-01-24 – 2024-01-26 (×5): 400 mg via ORAL
  Filled 2024-01-24 (×5): qty 1

## 2024-01-24 NOTE — Consults (Signed)
 COpAT/ID [email or web address] Initial Note    Reason for Consult: Cellulitis    Pertinent Clinical History: [name]. is a 48 year-old male with obesity (BMI 35 kg/m^2), diabetes mellitus type 2/diabetic left foot infection(s) status-post left first toe amputation, hypertension, hyperlipidemia, Osgood-Schlatter's disease, and perianal abscess admitted to [facility] since 01/23/2024 with diabetic left foot infection/cellulitis with concern for osteomyelitis.  He has been afebrile.  Additional details are included below.    Antimicrobials: Vancomycin  IV from 01/23/2024 (current)    PMHx: Obesity, diabetes mellitus type 2/diabetic left foot infection(s) status-post left first toe amputation, hypertension, hyperlipidemia, Osgood-Schlatter's disease, perianal abscess    Antimicrobial Allergies: None    Images:  01/24/2024 -          Labs, Micro, and Imaging Reviewed:    01/23/2024 CRP 3 mg/dL  1/84/7974 WBC 87,699 with ANC 7,500, hemoglobin 11.8 g/dL, hematocrit 65.1%, platelets 397,000, BUN 18 mg/dL, creatinine 9.09 mg/dL, total bilirubin 0.6 mg/dL, direct bilirubin 9.88 mg/dL, AST 14 U/L, ALT 14 U/L, alkaline phosphatase 81 U/L    01/23/2024 Blood cultures X 2 sets: Pending    03/28/2023 MRI left foot with/without IV contrast: Small amount of patchy marrow edema involving distal phalanx of first toe felt to be reactive.  No definitive findings of osteomyelitis or septic arthritis.  Soft tissue wound noted without organized drainable soft tissue fluid collection or tenosynovitis.  Findings of regional subcutaneous edema and suspected cellulitis of first toe.  01/23/2024 CT left lower extremity with IV contrast: Destructive changes of distal second and third metatarsals and proximal phalanx of third toe.  This could be due to osteomyelitis.  Fragmentation and midfoot due to healing fractures and/or neuropathic joints.  Diffuse soft tissue swelling of foot that could be due to cellulitis.  No evidence of  abscess.    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 Please start ertapenem  1 g IV Q24 hours for now.  2. Please stop vancomycin  IV.  3. Please check MRSA screen.  If it were to return positive, please add linezolid 600 mg PO BID.  4. Will follow for result of MRI left foot that is currently planned/ordered.  Suggest performing with rather than without contrast.  5. Depending on MRI findings, please consider evaluation by surgical team.  6. Please monitor labs including CBC with differential, BUN, creatinine, hepatic function panel, and inflammatory CRP.  7. Will follow and update recommendations.    I spent 5 minutes or more reviewing the patient's medical record, lab/micro/imaging studies, and/or medications.  Communicated with provider through Secure Chat/In [employer].  Total time >31 min.     Zada Adina Fiscal, M.D., 01/24/2024  COpAT Medical Director and Assistant Professor  Division of Infectious Diseases  [facility]

## 2024-01-24 NOTE — Progress Notes (Signed)
 [facility]               IP PROGRESS NOTE      [name], [name].  Date of Admission:  01/23/2024  Date of Birth:  1976/02/23  Date of Service:  01/24/2024    Hospital Day:  LOS: 0 days     Subjective:   Justin [name] is a 48 y/o male who is being seen in f/u for left LE cellulitis with suspected osteomyelitis.  Currently awaiting MRI of the extremity for confirmation Patient currently on Vanc. He is diabetic and had his prior surgery done at [facility].         Vital Signs:  Temp (24hrs) Max:36.6 C (97.8 F)      Temperature: 36.6 C (97.8 F)  BP (Non-Invasive): 134/76  MAP (Non-Invasive): 95 mmHG  Heart Rate: 97  Respiratory Rate: 18  SpO2: 98 %    Current Medications:  acetaminophen  (TYLENOL ) tablet, 650 mg, Oral, Q4H PRN  aluminum -magnesium  hydroxide-simethicone  (MAG-AL PLUS) 200-200-20 mg per 5 mL oral liquid, 30 mL, Oral, Q4H PRN  atorvastatin  (LIPITOR) tablet, 80 mg, Oral, Daily  Correction/SSIP insulin  lispro 100 units/mL injection, 3-14 Units, Subcutaneous, 4x/day AC  dextrose  (GLUTOSE) 40% oral gel, 15 g, Oral, Q15 Min PRN  dextrose  50% (0.5 g/mL) injection - syringe, 12.5 g, Intravenous, Q15 Min PRN  docusate sodium  (COLACE) capsule, 100 mg, Oral, 2x/day PRN  enoxaparin  PF (LOVENOX ) 40 mg/0.4 mL SubQ injection, 40 mg, Subcutaneous, Daily  folic acid  (FOLVITE ) tablet, 1 mg, Oral, Daily  glucagon  injection 1 mg, 1 mg, IntraMUSCULAR, Once PRN  lisinopril  (PRINIVIL ) tablet, 10 mg, Oral, Daily  magnesium  oxide (MAG-OX) 400mg  (241.3 mg elemental magnesium ) tablet, 400 mg, Oral, 2x/day  metoprolol  succinate (TOPROL -XL) 24 hr extended release tablet, 100 mg, Oral, Daily  multivitamin (THERA) tablet, 1 Tablet, Oral, Daily  ondansetron  (ZOFRAN ) 2 mg/mL injection, 4 mg, Intravenous, Q6H PRN  oxyCODONE  (ROXICODONE ) immediate release tablet, 5 mg, Oral, Q4H PRN  potassium phosphate  20 mmol in NS 500 mL IVPB, 20 mmol, Intravenous, Once  vancomycin  (VANCOCIN ) 1,250 mg in NS 250 mL IVPB, 15 mg/kg  (Adjusted), Intravenous, Q12H  Vancomycin  IV - Pharmacist to Dose per Protocol, , Does not apply, Daily PRN        Current Orders:  Active Orders   Microbiology    MRSA SCREEN     Frequency: ONE TIME     Number of Occurrences: 1 Occurrences   Imaging    MRI FOOT LEFT WO IV CONTRAST     Frequency: ONE TIME     Number of Occurrences: 1 Occurrences     Scheduling Instructions:               Lab    CBC     Frequency: ONE TIME     Number of Occurrences: 1 Occurrences    CBC     Frequency: ONE TIME     Number of Occurrences: 1 Occurrences    CBC     Frequency: ONE TIME     Number of Occurrences: 1 Occurrences    COMPREHENSIVE METABOLIC PANEL, NON-FASTING     Frequency: ONE TIME     Number of Occurrences: 1 Occurrences    COMPREHENSIVE METABOLIC PANEL, NON-FASTING     Frequency: ONE TIME     Number of Occurrences: 1 Occurrences    COMPREHENSIVE METABOLIC PANEL, NON-FASTING     Frequency: ONE TIME     Number of Occurrences: 1 Occurrences  EXTRA TUBES     Frequency: ONE TIME     Number of Occurrences: 1 Occurrences    MAGNESIUM      Frequency: ONE TIME     Number of Occurrences: 1 Occurrences    MAGNESIUM      Frequency: ONE TIME     Number of Occurrences: 1 Occurrences    MAGNESIUM      Frequency: ONE TIME     Number of Occurrences: 1 Occurrences    VANCOMYCIN , TROUGH     Frequency: ONE TIME     Number of Occurrences: 1 Occurrences   Diet    DIET NO CONCENTRATED SWEETS Do you want to initiate MNT Protocol? Yes; Calorie amount: CC 1800     Frequency: All Meals     Number of Occurrences: 1 Occurrences   Nursing    ACTIVITY     Frequency: UNTIL DISCONTINUED     Number of Occurrences: Until Specified    APPLY SEQUENTIAL COMPRESSION DEVICE     Frequency: ONE TIME     Number of Occurrences: 1 Occurrences    HYPOGLYCEMIA MANAGEMENT - CONSCIOUS PATIENT W/DIET ORDER     Frequency: UNTIL DISCONTINUED     Number of Occurrences: Until Specified    HYPOGLYCEMIA MANAGEMENT - UNCONSCIOUS/ALTERED/NPO PATIENT     Frequency: UNTIL  DISCONTINUED     Number of Occurrences: Until Specified    HYPOGLYCEMIA TREATMENT ALGORITHM     Frequency: UNTIL DISCONTINUED     Number of Occurrences: Until Specified    INTAKE AND OUTPUT QSHIFT     Frequency: QSHIFT     Number of Occurrences: Until Specified    MAINTAIN SEQUENTIAL COMPRESSION DEVICE     Frequency: CONTINUOUS     Number of Occurrences: Until Specified    Notify MD Vital Signs     Frequency: PRN     Number of Occurrences: Until Specified    NURSE TO ENTER SECONDARY ORDER Other - (specify in comments) (CHEST PAIN AND/OR ARRYTHMIA)     Frequency: UNTIL DISCONTINUED     Number of Occurrences: Until Specified     Order Comments: Cardiac Evaluation      PT IS INTERMEDIATE RISK FOR VENOUS THROMBOEMBOLISM     Frequency: CONTINUOUS     Number of Occurrences: Until Specified    PULSE OXIMETRY Q4H     Frequency: Q4H     Number of Occurrences: Until Specified    TELEMETRY MONITORING X 48H     Frequency: CONTINUOUS X 48 HRS     Number of Occurrences: 48 Hours    VITAL SIGNS  PRIOR TO DISCHARGE     Frequency: PRIOR TO DISCHARGE     Number of Occurrences: 1 Occurrences    VITAL SIGNS  Q4H     Frequency: Q4H     Number of Occurrences: Until Specified    WEIGH PATIENT     Frequency: DAILY (0600)     Number of Occurrences: Until Specified    WEIGH PATIENT     Frequency: UPON ADMISSION     Number of Occurrences: 1 Occurrences   Code Status    FULL CODE: ATTEMPT RESUSCITATION / CPR     Frequency: CONTINUOUS     Number of Occurrences: Until Specified     Order Comments: Patient wishes for full ICU level care including advanced airway interventions / mechanical ventilation.     In the event of pulseless cardiac arrest, patient consents to ACLS (advanced cardiac life support) to attempt resuscitation.  IE - Consents  to chest compressions, life support including intubation, mechanical ventilation, defibrillation/cardioversion as indicated.         OT    OT EVAL & TREAT     Frequency: Per Therapist Discretion     Number  of Occurrences: 1 Occurrences     Scheduling Instructions:               PT    PT EVALUATE AND TREAT     Frequency: Per Therapist Discretion     Number of Occurrences: 1 Occurrences     Order Comments: If patient's O2 level drops, PT may increase O2 until sats are > 93%.       Scheduling Instructions:               Respiratory Care    OXYGEN - NASAL CANNULA     Frequency: QHS PRN     Number of Occurrences: Until Specified     Order Comments: Flowrate should not exeed 6L/min except [facility] facility.          Point of Care Testing    PERFORM POC WHOLE BLOOD GLUCOSE     Frequency: TID AC & HS     Number of Occurrences: Until Specified   Medications    acetaminophen  (TYLENOL ) tablet     Frequency: Q4H PRN     Dose: 650 mg     Route: Oral    aluminum -magnesium  hydroxide-simethicone  (MAG-AL PLUS) 200-200-20 mg per 5 mL oral liquid     Frequency: Q4H PRN     Dose: 30 mL     Route: Oral    atorvastatin  (LIPITOR) tablet     Frequency: Daily     Dose: 80 mg     Route: Oral    Correction/SSIP insulin  lispro 100 units/mL injection     Frequency: 4x/day AC     Dose: 3-14 Units     Route: Subcutaneous    dextrose  (GLUTOSE) 40% oral gel     Frequency: Q15 Min PRN     Dose: 15 g     Route: Oral    dextrose  50% (0.5 g/mL) injection - syringe     Frequency: Q15 Min PRN     Dose: 12.5 g     Route: Intravenous    docusate sodium  (COLACE) capsule     Frequency: 2x/day PRN     Dose: 100 mg     Route: Oral    enoxaparin  PF (LOVENOX ) 40 mg/0.4 mL SubQ injection     Frequency: Daily     Dose: 40 mg     Route: Subcutaneous    folic acid  (FOLVITE ) tablet     Frequency: Daily     Dose: 1 mg     Route: Oral    glucagon  injection 1 mg     Frequency: Once PRN     Dose: 1 mg     Route: IntraMUSCULAR    lisinopril  (PRINIVIL ) tablet     Frequency: Daily     Dose: 10 mg     Route: Oral    magnesium  oxide (MAG-OX) 400mg  (241.3 mg elemental magnesium ) tablet     Frequency: 2x/day     Dose: 400 mg     Route: Oral    metoprolol  succinate (TOPROL -XL) 24 hr  extended release tablet     Frequency: Daily     Dose: 100 mg     Route: Oral    multivitamin (THERA) tablet     Frequency: Daily  Dose: 1 Tablet     Route: Oral    ondansetron  (ZOFRAN ) 2 mg/mL injection     Frequency: Q6H PRN     Dose: 4 mg     Route: Intravenous    oxyCODONE  (ROXICODONE ) immediate release tablet     Frequency: Q4H PRN     Dose: 5 mg     Route: Oral    potassium phosphate  20 mmol in NS 500 mL IVPB     Frequency: Once     Dose: 20 mmol     Route: Intravenous    vancomycin  (VANCOCIN ) 1,250 mg in NS 250 mL IVPB     Frequency: Q12H     Dose: 15 mg/kg (Adjusted)     Route: Intravenous    Vancomycin  IV - Pharmacist to Dose per Protocol     Frequency: Daily PRN     Route: Does not apply        Review of Systems:  Focused review of system was completed. Refer to the HPI for ROS details.     Today's Physical Exam:  Physical Exam  Constitutional:       General: He is not in acute distress.     Appearance: He is not ill-appearing.   HENT:      Head: Normocephalic and atraumatic.   Musculoskeletal:      Right lower leg: Edema present.      Left lower leg: Edema present.   Skin:     General: Skin is warm.   Neurological:      General: No focal deficit present.      Mental Status: He is alert and oriented to person, place, and time.   Psychiatric:         Mood and Affect: Mood normal.         Thought Content: Thought content normal.         Judgment: Judgment normal.        I/O:  I/O last 24 hours:    Intake/Output Summary (Last 24 hours) at 01/24/2024 1434  Last data filed at 01/24/2024 1214  Gross per 24 hour   Intake 1575 ml   Output --   Net 1575 ml     I/O current shift:  08/15 0700 - 08/15 1859  In: 1012.5   Out: -   Labs:  Reviewed: I have reviewed all lab results.  Lab Results Today:    Results for orders placed or performed during the hospital encounter of 01/23/24 (from the past 24 hours)   COMPREHENSIVE METABOLIC PANEL, NON-FASTING   Result Value Ref Range    SODIUM 138 136 - 145 mmol/L    POTASSIUM  3.7 3.5 - 5.1 mmol/L    CHLORIDE 105 98 - 107 mmol/L    CO2 TOTAL 25 21 - 31 mmol/L    ANION GAP 8 4 - 13 mmol/L    BUN 25 7 - 25 mg/dL    CREATININE 8.92 9.39 - 1.30 mg/dL    BUN/CREA RATIO 23 (H) 6 - 22    ESTIMATED GFR 87 >59 mL/min/1.81m^2    ALBUMIN 4.1 3.5 - 5.7 g/dL    CALCIUM 89.9 8.6 - 89.6 mg/dL    GLUCOSE 66 (L) 74 - 109 mg/dL    ALKALINE PHOSPHATASE 83 34 - 104 U/L    ALT (SGPT) 16 7 - 52 U/L    AST (SGOT) 17 13 - 39 U/L    BILIRUBIN TOTAL 0.7 0.3 - 1.0 mg/dL  PROTEIN TOTAL 7.3 6.4 - 8.9 g/dL    ALBUMIN/GLOBULIN RATIO 1.3 0.8 - 1.4    OSMOLALITY, CALCULATED 278 270 - 290 mOsm/kg    CALCIUM, CORRECTED 9.9 8.9 - 10.8 mg/dL    GLOBULIN 3.2 2.0 - 3.5   SEDIMENTATION RATE   Result Value Ref Range    ERYTHROCYTE SEDIMENTATION RATE (ESR) 45 (H) <15 mm/hr   C-REACTIVE PROTEIN (CRP)   Result Value Ref Range    C-REACTIVE PROTEIN (CRP) 3.0 (H) 0.1 - 0.5 mg/dL   CBC WITH DIFF   Result Value Ref Range    WBC 13.2 (H) 3.6 - 10.2 x10^3/uL    RBC 4.15 4.06 - 5.63 x10^6/uL    HGB 11.7 (L) 12.5 - 16.3 g/dL    HCT 64.7 (L) 63.2 - 47.1 %    MCV 84.8 73.0 - 96.2 fL    MCH 28.2 23.8 - 33.4 pg    MCHC 33.2 32.5 - 36.3 g/dL    RDW 85.7 87.8 - 83.7 %    PLATELETS 399 140 - 440 x10^3/uL    MPV 7.2 (L) 7.4 - 11.4 fL    NEUTROPHIL % 55 44 - 74 %    LYMPHOCYTE % 31 15 - 43 %    MONOCYTE % 12 6 - 14 %    EOSINOPHIL % 2 1 - 8 %    BASOPHIL % 1 0 - 1 %    NEUTROPHIL # 7.30 1.85 - 7.84 x10^3/uL    LYMPHOCYTE # 4.10 (H) 1.00 - 3.00 x10^3/uL    MONOCYTE # 1.50 (H) 0.30 - 1.10 x10^3/uL    EOSINOPHIL # 0.20 0.00 - 0.60 x10^3/uL    BASOPHIL # 0.10 0.00 - 0.10 x10^3/uL   POC BLOOD GLUCOSE (RESULTS)   Result Value Ref Range    GLUCOSE, POC 40 (LL) 70 - 100 mg/dl   LACTIC ACID LEVEL W/ REFLEX FOR LEVEL >2.0   Result Value Ref Range    LACTIC ACID 1.4 0.5 - 2.2 mmol/L   POC BLOOD GLUCOSE (RESULTS)   Result Value Ref Range    GLUCOSE, POC 51 (L) 70 - 100 mg/dl   URINALYSIS, MACROSCOPIC   Result Value Ref Range    COLOR Light Yellow Colorless,  Light Yellow, Yellow    APPEARANCE Clear Clear    SPECIFIC GRAVITY 1.038 (H) 1.002 - 1.030    PH 6.0 5.0 - 9.0    LEUKOCYTES Negative Negative, 100  WBCs/uL    NITRITE Negative Negative    PROTEIN Negative Negative, 10 , 20  mg/dL    GLUCOSE Negative Negative, 30  mg/dL    KETONES Negative Negative, Trace mg/dL    BILIRUBIN Negative Negative, 0.5 mg/dL    BLOOD Negative Negative, 0.03 mg/dL    UROBILINOGEN Normal Normal mg/dL   URINALYSIS, MICROSCOPIC   Result Value Ref Range    MUCOUS Rare Rare, Occasional, Few /hpf    RBCS <1 <4 /hpf    WBCS <1 <6 /hpf    SQUAMOUS EPITHELIAL <1 <28 /hpf   POC BLOOD GLUCOSE (RESULTS)   Result Value Ref Range    GLUCOSE, POC 121 (H) 70 - 100 mg/dl   ECG 12 LEAD   Result Value Ref Range    Ventricular rate 98 BPM    Atrial Rate 98 BPM    PR Interval 200 ms    QRS Duration 80 ms    QT Interval 342 ms    QTC Calculation 436 ms    Calculated P  Axis 56 degrees    Calculated R Axis 43 degrees    Calculated T Axis 21 degrees   PTT (PARTIAL THROMBOPLASTIN TIME)   Result Value Ref Range    APTT 53.7 (H) 25.0 - 38.0 seconds   PT/INR   Result Value Ref Range    PROTHROMBIN TIME 14.7 (H) 9.8 - 12.7 seconds    INR 1.30 (H) 0.84 - 1.10   TROPONIN-I   Result Value Ref Range    TROPONIN I 3 <20 ng/L   BASIC METABOLIC PANEL, NON-FASTING   Result Value Ref Range    SODIUM 138 136 - 145 mmol/L    POTASSIUM 4.1 3.5 - 5.1 mmol/L    CHLORIDE 107 98 - 107 mmol/L    CO2 TOTAL 24 21 - 31 mmol/L    ANION GAP 7 4 - 13 mmol/L    CALCIUM 9.9 8.6 - 10.3 mg/dL    GLUCOSE 98 74 - 890 mg/dL    BUN 18 7 - 25 mg/dL    CREATININE 9.09 9.39 - 1.30 mg/dL    BUN/CREA RATIO 20 6 - 22    ESTIMATED GFR 107 >59 mL/min/1.48m^2    OSMOLALITY, CALCULATED 278 270 - 290 mOsm/kg   MAGNESIUM    Result Value Ref Range    MAGNESIUM  1.7 (L) 1.9 - 2.7 mg/dL   PHOSPHORUS   Result Value Ref Range    PHOSPHORUS 3.6 (L) 3.7 - 7.2 mg/dL   HEPATIC FUNCTION PANEL   Result Value Ref Range    ALBUMIN 3.9 3.5 - 5.7 g/dL    ALKALINE PHOSPHATASE 81  34 - 104 U/L    ALT (SGPT) 14 7 - 52 U/L    AST (SGOT) 14 13 - 39 U/L    BILIRUBIN TOTAL 0.6 0.3 - 1.0 mg/dL    BILIRUBIN, INDIRECT 0.49 <=1 mg/dL    PROTEIN TOTAL 6.9 6.4 - 8.9 g/dL    GLOBULIN 3.0 2.0 - 3.5    ALBUMIN/GLOBULIN RATIO 1.3 0.8 - 1.4    BILIRUBIN DIRECT 0.11 0.03 - 0.18 md/dL   CBC WITH DIFF   Result Value Ref Range    WBC 12.3 (H) 3.6 - 10.2 x10^3/uL    RBC 4.11 4.06 - 5.63 x10^6/uL    HGB 11.8 (L) 12.5 - 16.3 g/dL    HCT 65.1 (L) 63.2 - 47.1 %    MCV 84.6 73.0 - 96.2 fL    MCH 28.7 23.8 - 33.4 pg    MCHC 33.9 32.5 - 36.3 g/dL    RDW 85.8 87.8 - 83.7 %    PLATELETS 397 140 - 440 x10^3/uL    MPV 7.2 (L) 7.4 - 11.4 fL    NEUTROPHIL % 61 44 - 74 %    LYMPHOCYTE % 28 15 - 43 %    MONOCYTE % 9 6 - 14 %    EOSINOPHIL % 2 1 - 8 %    BASOPHIL % 0 0 - 1 %    NEUTROPHIL # 7.50 1.85 - 7.84 x10^3/uL    LYMPHOCYTE # 3.40 (H) 1.00 - 3.00 x10^3/uL    MONOCYTE # 1.10 0.30 - 1.10 x10^3/uL    EOSINOPHIL # 0.30 0.00 - 0.60 x10^3/uL    BASOPHIL # 0.00 0.00 - 0.10 x10^3/uL   POC BLOOD GLUCOSE (RESULTS)   Result Value Ref Range    GLUCOSE, POC 87 70 - 100 mg/dl   POC BLOOD GLUCOSE (RESULTS)   Result Value Ref Range    GLUCOSE, POC 105 (H)  70 - 100 mg/dl     Micro Results: No results found for any visits on 01/23/24 (from the past 24 hours).  Images:   No results found.   CT EXTREMITY LOWER LEFT W IV CONTRAST   Final Result   Destructive changes of the distal second and third metatarsals and proximal phalanx of the third toe. This could be due to osteomyelitis.      Fragmentation in the midfoot to the due to healing fractures and/or neuropathic joints.      Diffuse soft tissue swelling of the foot could be due to cellulitis. There is no evidence of abscess.               One or more dose reduction techniques were used (e.g., Automated exposure control, adjustment of the mA and/or kV according to patient size, use of iterative reconstruction technique).         Radiologist location ID: [id number]              Problem  List:  Active Hospital Problems   (*Primary Problem)    Diagnosis    *Cellulitis       Nutrition:    DIET NO CONCENTRATED SWEETS Do you want to initiate MNT Protocol? Yes; Calorie amount: CC 1800    Additional clinical characteristics related to nutrition:    - monitor for weight changes   - monitor intake and output    - monitor bowel functions                   Assessment/ Plan:   Left diabetic foot wound/cellulitis with suspected osteomyelitis   -CT of the left LE showed Destructive changes of the distal second and third metatarsals and proximal phalanx of the third toe. This could be due to osteomyelitis.   Fragmentation in the midfoot to the due to healing fractures and/or neuropathic joints.   Diffuse soft tissue swelling of the foot could be due to cellulitis. There is no evidence of abscess.  -IV Vanc for now   -COpAT consulted   -MRI pending   -CRP 3.0  -further interventions will depend on MRI results and COpAT recommendations     T2DM   -SSI     HTN   -continue home meds     Chronic pain   -continue roxicodone       -further treatment adjustments will be made based off clinical course.  See orders for further details.  -Hospitalist personally evaluated and examined the patient in conjunction with the MLP and agree with the assessments, treatment plan and disposition of the patient as recorded by the [facility].       DVT/PE Prophylaxis: Lovenox      Daily Orders (From admission, onward)       Start     Ordered    01/24/24 0900  enoxaparin  PF (LOVENOX ) 40 mg/0.4 mL SubQ injection  (MEDIUM RISK VTE LEVEL)  Daily        Question:  Select Indication of Use  Answer:  VTE Prophylaxis    01/23/24 2150    01/23/24 2200  PT IS INTERMEDIATE RISK FOR VENOUS THROMBOEMBOLISM  (MEDIUM RISK VTE LEVEL)  CONTINUOUS        References:    VTE RISK ASSESSMENT TOOL    01/23/24 2150    01/23/24 2200  MAINTAIN SEQUENTIAL COMPRESSION DEVICE  (MEDIUM RISK VTE LEVEL)  CONTINUOUS        Question Answer Comment   Therapy Plan To Be Used  Alone-Patient with other Contraindication    Rationale for medication contraindication: High Bleed Risk - Reassess Daily        01/23/24 2150    01/23/24 2200  APPLY SEQUENTIAL COMPRESSION DEVICE  (MEDIUM RISK VTE LEVEL)  ONE TIME         01/23/24 2150                  Anticoagulants (last 24 hours)       Date/Time Action Medication Dose    01/24/24 0856 Given    enoxaparin  PF (LOVENOX ) 40 mg/0.4 mL SubQ injection 40 mg             Naveen Lorusso D Shaquinta Peruski, PA-C

## 2024-01-24 NOTE — Care Plan (Signed)
 Pt admitted with cellulitis. Pt is on RA. Pt up ab lib. ACHS. Fall prevention protocol maintained. Bed alarm set with call light in reach. Education is ongoing. Discharge planning is in place.   Problem: Adult Inpatient Plan of Care  Goal: Plan of Care Review  Outcome: Ongoing (see interventions/notes)  Goal: Patient-Specific Goal (Individualized)  Outcome: Ongoing (see interventions/notes)  Flowsheets (Taken 01/24/2024 0300)  Individualized Care Needs: montior labs and vs  Anxieties, Fears or Concerns: none voiced at this time  Goal: Absence of Hospital-Acquired Illness or Injury  Outcome: Ongoing (see interventions/notes)  Intervention: Identify and Manage Fall Risk  Recent Flowsheet Documentation  Taken 01/24/2024 0350 by Albino DASEN, GN  Safety Promotion/Fall Prevention:   activity supervised   safety round/check completed   nonskid shoes/slippers when out of bed   fall prevention program maintained  Intervention: Prevent Skin Injury  Recent Flowsheet Documentation  Taken 01/24/2024 0350 by Albino DASEN, GN  Body Position: neutral head position, midline maintained  Skin Protection: adhesive use limited  Intervention: Prevent and Manage VTE (Venous Thromboembolism) Risk  Recent Flowsheet Documentation  Taken 01/24/2024 0350 by Albino DASEN, GN  Sequential Compression Device (SCDs): Sequential compression device off  VTE Prevention/Management: ambulation promoted  Intervention: Prevent Infection  Recent Flowsheet Documentation  Taken 01/24/2024 0350 by Albino DASEN, GN  Infection Prevention:   rest/sleep promoted   promote handwashing  Goal: Optimal Comfort and Wellbeing  Outcome: Ongoing (see interventions/notes)  Intervention: Provide Person-Centered Care  Recent Flowsheet Documentation  Taken 01/24/2024 0350 by Albino DASEN, GN  Trust Relationship/Rapport: care explained  Goal: Rounds/Family Conference  Outcome: Ongoing (see interventions/notes)     Problem: Skin Injury Risk Increased  Goal: Skin Health and Integrity  Outcome: Ongoing  (see interventions/notes)  Intervention: Optimize Skin Protection  Recent Flowsheet Documentation  Taken 01/24/2024 0350 by Albino DASEN, GN  Skin Protection: adhesive use limited  Head of Bed (HOB) Positioning: HOB elevated

## 2024-01-24 NOTE — ED Nurses Note (Signed)
Report called to Estill Bamberg, RN on [place] at this time.

## 2024-01-25 ENCOUNTER — Observation Stay (HOSPITAL_COMMUNITY)

## 2024-01-25 ENCOUNTER — Other Ambulatory Visit: Payer: Self-pay

## 2024-01-25 DIAGNOSIS — E66812 Obesity, class 2: Secondary | ICD-10-CM

## 2024-01-25 DIAGNOSIS — Z7985 Long-term (current) use of injectable non-insulin antidiabetic drugs: Secondary | ICD-10-CM

## 2024-01-25 DIAGNOSIS — Z87891 Personal history of nicotine dependence: Secondary | ICD-10-CM

## 2024-01-25 DIAGNOSIS — L089 Local infection of the skin and subcutaneous tissue, unspecified: Secondary | ICD-10-CM

## 2024-01-25 DIAGNOSIS — M79672 Pain in left foot: Secondary | ICD-10-CM

## 2024-01-25 DIAGNOSIS — Z7984 Long term (current) use of oral hypoglycemic drugs: Secondary | ICD-10-CM

## 2024-01-25 DIAGNOSIS — E119 Type 2 diabetes mellitus without complications: Secondary | ICD-10-CM

## 2024-01-25 LAB — COMPREHENSIVE METABOLIC PANEL, NON-FASTING
ALBUMIN/GLOBULIN RATIO: 1.4 (ref 0.8–1.4)
ALBUMIN: 3.7 g/dL (ref 3.5–5.7)
ALKALINE PHOSPHATASE: 72 U/L (ref 34–104)
ALT (SGPT): 12 U/L (ref 7–52)
ANION GAP: 7 mmol/L (ref 4–13)
AST (SGOT): 12 U/L — ABNORMAL LOW (ref 13–39)
BILIRUBIN TOTAL: 0.6 mg/dL (ref 0.3–1.0)
BUN/CREA RATIO: 16 (ref 6–22)
BUN: 13 mg/dL (ref 7–25)
CALCIUM, CORRECTED: 10.2 mg/dL (ref 8.9–10.8)
CALCIUM: 10 mg/dL (ref 8.6–10.3)
CHLORIDE: 108 mmol/L — ABNORMAL HIGH (ref 98–107)
CO2 TOTAL: 23 mmol/L (ref 21–31)
CREATININE: 0.79 mg/dL (ref 0.60–1.30)
ESTIMATED GFR: 112 mL/min/1.73mˆ2 (ref >59)
GLOBULIN: 2.7 (ref 2.0–3.5)
GLUCOSE: 88 mg/dL (ref 74–109)
OSMOLALITY, CALCULATED: 275 mosm/kg (ref 270–290)
POTASSIUM: 4.3 mmol/L (ref 3.5–5.1)
PROTEIN TOTAL: 6.4 g/dL (ref 6.4–8.9)
SODIUM: 138 mmol/L (ref 136–145)

## 2024-01-25 LAB — CBC
HCT: 32.6 % — ABNORMAL LOW (ref 36.7–47.1)
HGB: 11 g/dL — ABNORMAL LOW (ref 12.5–16.3)
MCH: 28.3 pg (ref 23.8–33.4)
MCHC: 33.6 g/dL (ref 32.5–36.3)
MCV: 84.1 fL (ref 73.0–96.2)
MPV: 6.6 fL — ABNORMAL LOW (ref 7.4–11.4)
PLATELETS: 378 x10ˆ3/uL (ref 140–440)
RBC: 3.87 x10ˆ6/uL — ABNORMAL LOW (ref 4.06–5.63)
RDW: 14.3 % (ref 12.1–16.2)
WBC: 11.6 x10ˆ3/uL — ABNORMAL HIGH (ref 3.6–10.2)

## 2024-01-25 LAB — POC BLOOD GLUCOSE (RESULTS)
GLUCOSE, POC: 130 mg/dL — ABNORMAL HIGH (ref 70–100)
GLUCOSE, POC: 148 mg/dL — ABNORMAL HIGH (ref 70–100)
GLUCOSE, POC: 156 mg/dL — ABNORMAL HIGH (ref 70–100)

## 2024-01-25 LAB — MAGNESIUM: MAGNESIUM: 1.7 mg/dL — ABNORMAL LOW (ref 1.9–2.7)

## 2024-01-25 MED ORDER — PANTOPRAZOLE 40 MG TABLET,DELAYED RELEASE
40.0000 mg | DELAYED_RELEASE_TABLET | Freq: Every day | ORAL | Status: DC
Start: 2024-01-25 — End: 2024-01-26
  Administered 2024-01-25 – 2024-01-26 (×2): 40 mg via ORAL
  Filled 2024-01-25 (×2): qty 1

## 2024-01-25 MED ORDER — GADOBUTROL 10 MMOL/10 ML (1 MMOL/ML) INTRAVENOUS SOLUTION
10.0000 mL | INTRAVENOUS | Status: AC
Start: 2024-01-25 — End: 2024-01-25
  Administered 2024-01-25: 10 mL via INTRAVENOUS

## 2024-01-25 NOTE — Nurses Notes (Signed)
 Dr. Donnal Debar notified of consult.

## 2024-01-25 NOTE — Progress Notes (Signed)
 [facility]               IP PROGRESS NOTE      Racicot, JAVONNE DORKO.  Date of Admission:  01/23/2024  Date of Birth:  [birth year]  Date of Service:  01/25/2024    Hospital Day:  LOS: 0 days     Subjective:   Justin [name] is a 48 y/o male who is being seen in f/u for left LE cellulitis with suspected osteomyelitis.  Currently awaiting MRI of the extremity for confirmation Patient currently on Vanc. He is diabetic and had his prior surgery done at [facility].       01/25/2024 Patient seen and examined at bedside.   Would like Dr Marlyce to do the surgery if needed. States his foot is looking better this am.       Vital Signs:  Temp (24hrs) Max:36.9 C (98.4 F)      Temperature: 36.6 C (97.9 F)  BP (Non-Invasive): 111/78  MAP (Non-Invasive): 77 mmHG  Heart Rate: 98  Respiratory Rate: 19  SpO2: 95 %    Current Medications:  acetaminophen  (TYLENOL ) tablet, 650 mg, Oral, Q4H PRN  aluminum -magnesium  hydroxide-simethicone  (MAG-AL PLUS) 200-200-20 mg per 5 mL oral liquid, 30 mL, Oral, Q4H PRN  atorvastatin  (LIPITOR) tablet, 80 mg, Oral, Daily  Correction/SSIP insulin  lispro 100 units/mL injection, 3-14 Units, Subcutaneous, 4x/day AC  dextrose  (GLUTOSE) 40% oral gel, 15 g, Oral, Q15 Min PRN  dextrose  50% (0.5 g/mL) injection - syringe, 12.5 g, Intravenous, Q15 Min PRN  docusate sodium  (COLACE) capsule, 100 mg, Oral, 2x/day PRN  enoxaparin  PF (LOVENOX ) 40 mg/0.4 mL SubQ injection, 40 mg, Subcutaneous, Daily  ertapenem  (INVanz ) 1,000 mg in NS 100 mL IVPB with adaptor, 1,000 mg, Intravenous, QPM  folic acid  (FOLVITE ) tablet, 1 mg, Oral, Daily  glucagon  injection 1 mg, 1 mg, IntraMUSCULAR, Once PRN  lisinopril  (PRINIVIL ) tablet, 10 mg, Oral, Daily  magnesium  oxide (MAG-OX) 400mg  (241.3 mg elemental magnesium ) tablet, 400 mg, Oral, 2x/day  metoprolol  succinate (TOPROL -XL) 24 hr extended release tablet, 100 mg, Oral, Daily  multivitamin (THERA) tablet, 1 Tablet, Oral, Daily  ondansetron  (ZOFRAN ) 2 mg/mL injection, 4  mg, Intravenous, Q6H PRN  oxyCODONE  (ROXICODONE ) immediate release tablet, 5 mg, Oral, Q4H PRN  pantoprazole  (PROTONIX ) delayed release tablet, 40 mg, Oral, Daily        Current Orders:  Active Orders   Lab    C-REACTIVE PROTEIN (CRP)     Frequency: ONE TIME     Number of Occurrences: 1 Occurrences    CBC     Frequency: ONE TIME     Number of Occurrences: 1 Occurrences    CBC     Frequency: ONE TIME     Number of Occurrences: 1 Occurrences    COMPREHENSIVE METABOLIC PANEL, NON-FASTING     Frequency: ONE TIME     Number of Occurrences: 1 Occurrences    COMPREHENSIVE METABOLIC PANEL, NON-FASTING     Frequency: ONE TIME     Number of Occurrences: 1 Occurrences    EXTRA TUBES     Frequency: ONE TIME     Number of Occurrences: 1 Occurrences    MAGNESIUM      Frequency: ONE TIME     Number of Occurrences: 1 Occurrences    MAGNESIUM      Frequency: ONE TIME     Number of Occurrences: 1 Occurrences   Diet    DIET NO CONCENTRATED SWEETS Do you want to initiate MNT Protocol?  Yes; Calorie amount: CC 1800     Frequency: All Meals     Number of Occurrences: 1 Occurrences   Nursing    ACTIVITY     Frequency: UNTIL DISCONTINUED     Number of Occurrences: Until Specified    APPLY SEQUENTIAL COMPRESSION DEVICE     Frequency: ONE TIME     Number of Occurrences: 1 Occurrences    HYPOGLYCEMIA MANAGEMENT - CONSCIOUS PATIENT W/DIET ORDER     Frequency: UNTIL DISCONTINUED     Number of Occurrences: Until Specified    HYPOGLYCEMIA MANAGEMENT - UNCONSCIOUS/ALTERED/NPO PATIENT     Frequency: UNTIL DISCONTINUED     Number of Occurrences: Until Specified    HYPOGLYCEMIA TREATMENT ALGORITHM     Frequency: UNTIL DISCONTINUED     Number of Occurrences: Until Specified    INTAKE AND OUTPUT QSHIFT     Frequency: QSHIFT     Number of Occurrences: Until Specified    MAINTAIN SEQUENTIAL COMPRESSION DEVICE     Frequency: CONTINUOUS     Number of Occurrences: Until Specified    Notify MD Vital Signs     Frequency: PRN     Number of Occurrences: Until  Specified    NURSE TO ENTER SECONDARY ORDER Other - (specify in comments) (CHEST PAIN AND/OR ARRYTHMIA)     Frequency: UNTIL DISCONTINUED     Number of Occurrences: Until Specified     Order Comments: Cardiac Evaluation      PT IS INTERMEDIATE RISK FOR VENOUS THROMBOEMBOLISM     Frequency: CONTINUOUS     Number of Occurrences: Until Specified    PULSE OXIMETRY Q4H     Frequency: Q4H     Number of Occurrences: Until Specified    TELEMETRY MONITORING X 48H     Frequency: CONTINUOUS X 48 HRS     Number of Occurrences: 48 Hours    VITAL SIGNS  PRIOR TO DISCHARGE     Frequency: PRIOR TO DISCHARGE     Number of Occurrences: 1 Occurrences    VITAL SIGNS  Q4H     Frequency: Q4H     Number of Occurrences: Until Specified    WEIGH PATIENT     Frequency: DAILY (0600)     Number of Occurrences: Until Specified    WEIGH PATIENT     Frequency: UPON ADMISSION     Number of Occurrences: 1 Occurrences   Code Status    FULL CODE: ATTEMPT RESUSCITATION / CPR     Frequency: CONTINUOUS     Number of Occurrences: Until Specified     Order Comments: Patient wishes for full ICU level care including advanced airway interventions / mechanical ventilation.     In the event of pulseless cardiac arrest, patient consents to ACLS (advanced cardiac life support) to attempt resuscitation.  IE - Consents to chest compressions, life support including intubation, mechanical ventilation, defibrillation/cardioversion as indicated.         Consult    IP CONSULT TO GENERAL SURGERY On-Call Provider (nurse/clerk to determine)     Frequency: ONE TIME     Number of Occurrences: 1 Occurrences   OT    OT EVAL & TREAT     Frequency: Per Therapist Discretion     Number of Occurrences: 1 Occurrences     Scheduling Instructions:               PT    PT EVALUATE AND TREAT     Frequency: Per Therapist Discretion  Number of Occurrences: 1 Occurrences     Order Comments: If patient's O2 level drops, PT may increase O2 until sats are > 93%.       Scheduling  Instructions:               Respiratory Care    OXYGEN - NASAL CANNULA     Frequency: QHS PRN     Number of Occurrences: Until Specified     Order Comments: Flowrate should not exeed 6L/min except [facility] facility.          Point of Care Testing    PERFORM POC WHOLE BLOOD GLUCOSE     Frequency: TID AC & HS     Number of Occurrences: Until Specified   Medications    acetaminophen  (TYLENOL ) tablet     Frequency: Q4H PRN     Dose: 650 mg     Route: Oral    aluminum -magnesium  hydroxide-simethicone  (MAG-AL PLUS) 200-200-20 mg per 5 mL oral liquid     Frequency: Q4H PRN     Dose: 30 mL     Route: Oral    atorvastatin  (LIPITOR) tablet     Frequency: Daily     Dose: 80 mg     Route: Oral    Correction/SSIP insulin  lispro 100 units/mL injection     Frequency: 4x/day AC     Dose: 3-14 Units     Route: Subcutaneous    dextrose  (GLUTOSE) 40% oral gel     Frequency: Q15 Min PRN     Dose: 15 g     Route: Oral    dextrose  50% (0.5 g/mL) injection - syringe     Frequency: Q15 Min PRN     Dose: 12.5 g     Route: Intravenous    docusate sodium  (COLACE) capsule     Frequency: 2x/day PRN     Dose: 100 mg     Route: Oral    enoxaparin  PF (LOVENOX ) 40 mg/0.4 mL SubQ injection     Frequency: Daily     Dose: 40 mg     Route: Subcutaneous    ertapenem  (INVanz ) 1,000 mg in NS 100 mL IVPB with adaptor     Frequency: QPM     Dose: 1,000 mg     Route: Intravenous     Order Comments: Take into account clinical indication/patient scenario in regard to timing of the subsequent dose. Reschedule subsequent dose administration time if needed.  For NOW doses between 12 am - 11 am, recommend rescheduling next timed dose to 9 pm on same day; and doses between 12 pm - 11 pm, subsequent    folic acid  (FOLVITE ) tablet     Frequency: Daily     Dose: 1 mg     Route: Oral    glucagon  injection 1 mg     Frequency: Once PRN     Dose: 1 mg     Route: IntraMUSCULAR    lisinopril  (PRINIVIL ) tablet     Frequency: Daily     Dose: 10 mg     Route: Oral    magnesium   oxide (MAG-OX) 400mg  (241.3 mg elemental magnesium ) tablet     Frequency: 2x/day     Dose: 400 mg     Route: Oral    metoprolol  succinate (TOPROL -XL) 24 hr extended release tablet     Frequency: Daily     Dose: 100 mg     Route: Oral    multivitamin (THERA) tablet  Frequency: Daily     Dose: 1 Tablet     Route: Oral    ondansetron  (ZOFRAN ) 2 mg/mL injection     Frequency: Q6H PRN     Dose: 4 mg     Route: Intravenous    oxyCODONE  (ROXICODONE ) immediate release tablet     Frequency: Q4H PRN     Dose: 5 mg     Route: Oral    pantoprazole  (PROTONIX ) delayed release tablet     Frequency: Daily     Dose: 40 mg     Route: Oral        Review of Systems:  Focused review of system was completed. Refer to the HPI for ROS details.     Today's Physical Exam:  Physical Exam  Constitutional:       General: He is not in acute distress.     Appearance: He is not ill-appearing.   HENT:      Head: Normocephalic and atraumatic.   Musculoskeletal:      Right lower leg: Edema present.      Left lower leg: Edema present.   Skin:     General: Skin is warm.   Neurological:      General: No focal deficit present.      Mental Status: He is alert and oriented to person, place, and time.   Psychiatric:         Mood and Affect: Mood normal.         Thought Content: Thought content normal.         Judgment: Judgment normal.        I/O:  I/O last 24 hours:    Intake/Output Summary (Last 24 hours) at 01/25/2024 1342  Last data filed at 01/25/2024 0400  Gross per 24 hour   Intake 940 ml   Output --   Net 940 ml     I/O current shift:  No intake/output data recorded.  Labs:  Reviewed: I have reviewed all lab results.  Lab Results Today:    Results for orders placed or performed during the hospital encounter of 01/23/24 (from the past 24 hours)   POC BLOOD GLUCOSE (RESULTS)   Result Value Ref Range    GLUCOSE, POC 119 (H) 70 - 100 mg/dl   MRSA SCREEN    Specimen: Swab   Result Value Ref Range    MRSA No Growth    CBC   Result Value Ref Range    WBC  11.6 (H) 3.6 - 10.2 x10^3/uL    RBC 3.87 (L) 4.06 - 5.63 x10^6/uL    HGB 11.0 (L) 12.5 - 16.3 g/dL    HCT 67.3 (L) 63.2 - 47.1 %    MCV 84.1 73.0 - 96.2 fL    MCH 28.3 23.8 - 33.4 pg    MCHC 33.6 32.5 - 36.3 g/dL    RDW 85.6 87.8 - 83.7 %    PLATELETS 378 140 - 440 x10^3/uL    MPV 6.6 (L) 7.4 - 11.4 fL   COMPREHENSIVE METABOLIC PANEL, NON-FASTING   Result Value Ref Range    SODIUM 138 136 - 145 mmol/L    POTASSIUM 4.3 3.5 - 5.1 mmol/L    CHLORIDE 108 (H) 98 - 107 mmol/L    CO2 TOTAL 23 21 - 31 mmol/L    ANION GAP 7 4 - 13 mmol/L    BUN 13 7 - 25 mg/dL    CREATININE 9.20 9.39 - 1.30 mg/dL    BUN/CREA  RATIO 16 6 - 22    ESTIMATED GFR 112 >59 mL/min/1.2m^2    ALBUMIN 3.7 3.5 - 5.7 g/dL    CALCIUM 89.9 8.6 - 89.6 mg/dL    GLUCOSE 88 74 - 890 mg/dL    ALKALINE PHOSPHATASE 72 34 - 104 U/L    ALT (SGPT) 12 7 - 52 U/L    AST (SGOT) 12 (L) 13 - 39 U/L    BILIRUBIN TOTAL 0.6 0.3 - 1.0 mg/dL    PROTEIN TOTAL 6.4 6.4 - 8.9 g/dL    ALBUMIN/GLOBULIN RATIO 1.4 0.8 - 1.4    OSMOLALITY, CALCULATED 275 270 - 290 mOsm/kg    CALCIUM, CORRECTED 10.2 8.9 - 10.8 mg/dL    GLOBULIN 2.7 2.0 - 3.5   MAGNESIUM    Result Value Ref Range    MAGNESIUM  1.7 (L) 1.9 - 2.7 mg/dL   POC BLOOD GLUCOSE (RESULTS)   Result Value Ref Range    GLUCOSE, POC 130 (H) 70 - 100 mg/dl     Micro Results:   Hospital Encounter on 01/23/24 (from the past 24 hours)   MRSA SCREEN    Collection Time: 01/24/24  5:50 PM    Specimen: Swab   Culture Result Status    MRSA No Growth Preliminary     Images:   CT EXTREMITY LOWER LEFT W IV CONTRAST  Result Date: 01/23/2024  Impression Destructive changes of the distal second and third metatarsals and proximal phalanx of the third toe. This could be due to osteomyelitis. Fragmentation in the midfoot to the due to healing fractures and/or neuropathic joints. Diffuse soft tissue swelling of the foot could be due to cellulitis. There is no evidence of abscess. One or more dose reduction techniques were used (e.g., Automated exposure  control, adjustment of the mA and/or kV according to patient size, use of iterative reconstruction technique). 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 THE POSSIBILITY OF INFECTION IS NOT EXCLUDED. THERE IS ASSOCIATED SOFT TISSUE SWELLING OR CELLULITIS. NO VISIBLE ABSCESS.                     Radiologist location ID: [id number]         CT EXTREMITY LOWER LEFT W IV CONTRAST   Final Result   Destructive changes of the distal second and third metatarsals and proximal phalanx of the third toe. This could be due to osteomyelitis.      Fragmentation in the midfoot to the due to healing fractures and/or neuropathic joints.      Diffuse soft tissue swelling of the foot could be due to cellulitis. There is no evidence of abscess.               One or more dose reduction techniques were used (e.g., Automated exposure control, adjustment of the mA and/or kV according to patient size, use of iterative reconstruction technique).         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 Yes; Calorie amount: CC 1800    Additional clinical characteristics related to nutrition:    - monitor for weight changes   - monitor intake and output    - monitor bowel functions                   Assessment/ Plan:   Left diabetic foot wound/cellulitis with suspected osteomyelitis   -CT of the left LE showed Destructive changes of the distal second and third metatarsals and proximal phalanx of the third toe. This could be due to osteomyelitis.   Fragmentation in the midfoot to the due  to healing fractures and/or neuropathic joints.   Diffuse soft tissue swelling of the foot could be due to cellulitis. There is no evidence of abscess.  -COpAT consulted and Vanc discontinued, IV ertapenem  started.  MRSA screen remains negative.   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 The possibility of infection is not excluded. There is associated soft tissue swelling or cellulitis. No visible abscess.   -Surgery consulted.  Await further recommendations      T2DM   -SSI     HTN   -continue home meds     Chronic pain   -continue roxicodone       -further treatment adjustments will be made based off clinical course.  See orders for further details.  -Hospitalist personally evaluated and examined the patient in conjunction with the MLP and agree with the assessments, treatment plan and disposition of the patient as recorded by the [facility].       DVT/PE Prophylaxis: Lovenox      Daily Orders (From admission, onward)       Start     Ordered    01/24/24 0900  enoxaparin  PF (LOVENOX ) 40 mg/0.4 mL SubQ injection  (MEDIUM RISK VTE LEVEL)  Daily        Question:  Select Indication of Use  Answer:  VTE Prophylaxis    01/23/24 2150    01/23/24 2200  PT IS INTERMEDIATE RISK FOR VENOUS THROMBOEMBOLISM  (MEDIUM RISK VTE LEVEL)  CONTINUOUS        References:    VTE RISK ASSESSMENT TOOL    01/23/24 2150    01/23/24 2200  MAINTAIN SEQUENTIAL COMPRESSION DEVICE  (MEDIUM RISK VTE LEVEL)  CONTINUOUS        Question Answer Comment   Therapy Plan To Be Used Alone-Patient with other Contraindication    Rationale for medication contraindication: High Bleed Risk - Reassess Daily        01/23/24 2150    01/23/24 2200  APPLY SEQUENTIAL COMPRESSION DEVICE  (MEDIUM RISK VTE LEVEL)  ONE TIME         01/23/24 2150                  Anticoagulants (last 24 hours)       Date/Time Action Medication Dose    01/25/24 0849 Given    enoxaparin  PF (LOVENOX ) 40 mg/0.4 mL SubQ injection 40 mg             Bohdi Leeds D  Jaquae Rieves, PA-C

## 2024-01-25 NOTE — Consults (Signed)
 [facility]  General Surgery  Consultation    Date of Service:  01/25/2024  Justin [name], Justin [name]., 48 y.o. male  Date of Admission:  01/23/2024  Date of Birth:  [birth year]  PCP: Harlene DELENA Danger, FNP    Reason for Consultation:  Left lower extremity cellulitis rule out osteomyelitis    HPI:  Justin [name]. is a 48 y.o. White male whom I was asked to see for consultation regarding left lower extremity cellulitis rule out osteomyelitis.  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  The patient presents with soft tissue infection/cellulitis with concern for osteomyelitis.    Past Medical History:   Diagnosis Date    Amputated great toe     Diabetes mellitus, type 2     HLD (hyperlipidemia)     Hypertension     Intestinal hemorrhage     OCD (obsessive compulsive disorder)     Osgood-Schlatter's disease     Perianal abscess     Torsion of epididymis       Past Surgical History:   Procedure Laterality Date    COLONOSCOPY      CYSTECTOMY      HX APPENDECTOMY      LAPOROTOMY    SURGERY SCROTAL / TESTICULAR      TOE AMPUTATION Left     great toe      Social History[1]    Family Medical History:    None        Medications Prior to Admission       Prescriptions    Bacillus coagulans/inulin (PROBICHEW ORAL)    Take by mouth    HUMALOG  KWIKPEN INSULIN  100 unit/mL Subcutaneous Insulin  Pen    ADMINISTER UP TO 20 UNITS UNDER THE SKIN THREE TIMES DAILY BEFORE MEALS    IBU 800 mg Oral Tablet    MEN'S MULTI-VITAMIN ORAL    Take by mouth    metFORMIN (GLUCOPHAGE XR) 500 mg Oral Tablet Sustained Release 24 hr    2 Tablets (1,000 mg total) Daily    metoprolol  succinate (TOPROL -XL) 100 mg Oral Tablet Sustained Release 24 hr    Take 1 Tablet (100 mg total) by mouth Twice daily 100 mg QAM, 50 mg QPM    montelukast  (SINGULAIR ) 10 mg Oral Tablet    Patient not taking:  Reported on 01/24/2024    tirzepatide (MOUNJARO) 12.5 mg/0.5 mL Subcutaneous  Pen Injector    TOUJEO MAX U-300 SOLOSTAR 300 unit/mL (3 mL) Subcutaneous Insulin  Pen    100 units sc daily           Allergies[2]       Patient Vitals for the past 24 hrs:   BP Temp Pulse Resp SpO2 Weight   01/25/24 1140 111/78 36.6 C (97.9 F) 98 19 95 % --   01/25/24 1020 -- -- 98 -- 94 % --   01/25/24 0715 105/66 36.6 C (97.8 F) 96 18 96 % --   01/25/24 0610 -- -- -- -- -- 109 kg (240 lb 1 oz)   01/25/24 0347 127/70 36.6 C (97.9 F) 95 18 98 % --   01/24/24 2355 126/71 36.9 C (98.4 F) 97 16 97 % --   01/24/24 2134 -- -- 95 -- 96 % --   01/24/24 2034 139/79 36.8 C (98.2 F) 98 17 98 % --   01/24/24 1605 126/80 36.6 C (97.8 F) 93 18  97 % --          General: appropriate for age. in no acute distress.    Vital signs are present above and have been reviewed by me     HEENT: Atraumatic, Normocephalic. PERRLA. EOMI. Nose clear. Throat clear    Lungs: Nonlabored breathing with symmetric expansion. Clear to auscultation bilaterally    Heart:Regular wth respect to rate and rythmn.    Abdomen:Soft. Nontender. Nondistended and benign    Extremities:  Cellulitis of left lower extremity but no evidence for any need for debridement.    Neuro: Grossly normal motor and sensory function. CN's II through XII intact.    Psychiatric: Alert and oriented to person, place, and time. affect appropriate    Laboratory Data:     Results for orders placed or performed during the hospital encounter of 01/23/24 (from the past 24 hours)   POC BLOOD GLUCOSE (RESULTS)   Result Value Ref Range    GLUCOSE, POC 119 (H) 70 - 100 mg/dl   MRSA SCREEN    Specimen: Swab   Result Value Ref Range    MRSA No Growth    CBC   Result Value Ref Range    WBC 11.6 (H) 3.6 - 10.2 x10^3/uL    RBC 3.87 (L) 4.06 - 5.63 x10^6/uL    HGB 11.0 (L) 12.5 - 16.3 g/dL    HCT 67.3 (L) 63.2 - 47.1 %    MCV 84.1 73.0 - 96.2 fL    MCH 28.3 23.8 - 33.4 pg    MCHC 33.6 32.5 - 36.3 g/dL    RDW 85.6 87.8 - 83.7 %    PLATELETS 378 140 - 440 x10^3/uL    MPV 6.6 (L) 7.4 -  11.4 fL   COMPREHENSIVE METABOLIC PANEL, NON-FASTING   Result Value Ref Range    SODIUM 138 136 - 145 mmol/L    POTASSIUM 4.3 3.5 - 5.1 mmol/L    CHLORIDE 108 (H) 98 - 107 mmol/L    CO2 TOTAL 23 21 - 31 mmol/L    ANION GAP 7 4 - 13 mmol/L    BUN 13 7 - 25 mg/dL    CREATININE 9.20 9.39 - 1.30 mg/dL    BUN/CREA RATIO 16 6 - 22    ESTIMATED GFR 112 >59 mL/min/1.71m^2    ALBUMIN 3.7 3.5 - 5.7 g/dL    CALCIUM 89.9 8.6 - 89.6 mg/dL    GLUCOSE 88 74 - 890 mg/dL    ALKALINE PHOSPHATASE 72 34 - 104 U/L    ALT (SGPT) 12 7 - 52 U/L    AST (SGOT) 12 (L) 13 - 39 U/L    BILIRUBIN TOTAL 0.6 0.3 - 1.0 mg/dL    PROTEIN TOTAL 6.4 6.4 - 8.9 g/dL    ALBUMIN/GLOBULIN RATIO 1.4 0.8 - 1.4    OSMOLALITY, CALCULATED 275 270 - 290 mOsm/kg    CALCIUM, CORRECTED 10.2 8.9 - 10.8 mg/dL    GLOBULIN 2.7 2.0 - 3.5   MAGNESIUM    Result Value Ref Range    MAGNESIUM  1.7 (L) 1.9 - 2.7 mg/dL   POC BLOOD GLUCOSE (RESULTS)   Result Value Ref Range    GLUCOSE, POC 130 (H) 70 - 100 mg/dl       Imaging Studies:    CT EXTREMITY LOWER LEFT W IV CONTRAST   Final Result by Edi, Radresults In (08/14 2105)   Destructive changes of the distal second and third metatarsals and proximal phalanx of the third toe. This  could be due to osteomyelitis.      Fragmentation in the midfoot to the due to healing fractures and/or neuropathic joints.      Diffuse soft tissue swelling of the foot could be due to cellulitis. There is no evidence of abscess.               One or more dose reduction techniques were used (e.g., Automated exposure control, adjustment of the mA and/or kV according to patient size, use of iterative reconstruction technique).         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 In such instances, original meaning may be extrapolated by contextual derivation.    Karter Hellmer B Chaz Mcglasson, MD, MBA, FACS       [1]   Social History  Tobacco Use    Smoking status: Former     Types: Cigarettes    Smokeless tobacco: Never    Tobacco comments:     QUIT 5 1/2 YRS AGO   Vaping Use    Vaping status: Never Used   Substance Use Topics    Alcohol use: Yes     Comment: SOCIAL    Drug use: Never   [2] No Known Allergies

## 2024-01-26 DIAGNOSIS — L03116 Cellulitis of left lower limb: Principal | ICD-10-CM

## 2024-01-26 DIAGNOSIS — E1142 Type 2 diabetes mellitus with diabetic polyneuropathy: Secondary | ICD-10-CM

## 2024-01-26 LAB — COMPREHENSIVE METABOLIC PANEL, NON-FASTING
ALBUMIN/GLOBULIN RATIO: 1.4 (ref 0.8–1.4)
ALBUMIN: 3.8 g/dL (ref 3.5–5.7)
ALKALINE PHOSPHATASE: 74 U/L (ref 34–104)
ALT (SGPT): 13 U/L (ref 7–52)
ANION GAP: 6 mmol/L (ref 4–13)
AST (SGOT): 13 U/L (ref 13–39)
BILIRUBIN TOTAL: 0.6 mg/dL (ref 0.3–1.0)
BUN/CREA RATIO: 16 (ref 6–22)
BUN: 16 mg/dL (ref 7–25)
CALCIUM, CORRECTED: 10.2 mg/dL (ref 8.9–10.8)
CALCIUM: 10 mg/dL (ref 8.6–10.3)
CHLORIDE: 109 mmol/L — ABNORMAL HIGH (ref 98–107)
CO2 TOTAL: 23 mmol/L (ref 21–31)
CREATININE: 1.02 mg/dL (ref 0.60–1.30)
ESTIMATED GFR: 92 mL/min/1.73mˆ2 (ref >59)
GLOBULIN: 2.8 (ref 2.0–3.5)
GLUCOSE: 138 mg/dL — ABNORMAL HIGH (ref 74–109)
OSMOLALITY, CALCULATED: 279 mosm/kg (ref 270–290)
POTASSIUM: 4.8 mmol/L (ref 3.5–5.1)
PROTEIN TOTAL: 6.6 g/dL (ref 6.4–8.9)
SODIUM: 138 mmol/L (ref 136–145)

## 2024-01-26 LAB — CBC
HCT: 33.9 % — ABNORMAL LOW (ref 36.7–47.1)
HGB: 11.4 g/dL — ABNORMAL LOW (ref 12.5–16.3)
MCH: 28.8 pg (ref 23.8–33.4)
MCHC: 33.7 g/dL (ref 32.5–36.3)
MCV: 85.3 fL (ref 73.0–96.2)
MPV: 6.9 fL — ABNORMAL LOW (ref 7.4–11.4)
PLATELETS: 387 x10ˆ3/uL (ref 140–440)
RBC: 3.98 x10ˆ6/uL — ABNORMAL LOW (ref 4.06–5.63)
RDW: 14.1 % (ref 12.1–16.2)
WBC: 14 x10ˆ3/uL — ABNORMAL HIGH (ref 3.6–10.2)

## 2024-01-26 LAB — POC BLOOD GLUCOSE (RESULTS)
GLUCOSE, POC: 141 mg/dL — ABNORMAL HIGH (ref 70–100)
GLUCOSE, POC: 171 mg/dL — ABNORMAL HIGH (ref 70–100)

## 2024-01-26 LAB — MAGNESIUM: MAGNESIUM: 1.9 mg/dL (ref 1.9–2.7)

## 2024-01-26 LAB — C-REACTIVE PROTEIN (CRP): C-REACTIVE PROTEIN (CRP): 3.6 mg/dL — ABNORMAL HIGH (ref 0.1–0.5)

## 2024-01-26 LAB — MRSA SCREEN: MRSA: NO GROWTH

## 2024-01-26 MED ORDER — PANTOPRAZOLE 40 MG TABLET,DELAYED RELEASE
40.0000 mg | DELAYED_RELEASE_TABLET | Freq: Every day | ORAL | 0 refills | Status: AC
Start: 2024-01-27 — End: 2024-02-10

## 2024-01-26 MED ORDER — MOXIFLOXACIN 400 MG TABLET
400.0000 mg | ORAL_TABLET | Freq: Every day | ORAL | 0 refills | Status: AC
Start: 2024-01-26 — End: 2024-02-02

## 2024-01-26 MED ORDER — LISINOPRIL 10 MG TABLET
10.0000 mg | ORAL_TABLET | Freq: Every day | ORAL | Status: AC
Start: 2024-01-26 — End: ?

## 2024-01-26 MED ORDER — ATORVASTATIN 20 MG TABLET
80.0000 mg | ORAL_TABLET | Freq: Every day | ORAL | Status: AC
Start: 2024-01-26 — End: ?

## 2024-01-26 NOTE — Consults (Signed)
 COpAT/ID [id number] Follow-Up Note    Reason for Consult: Cellulitis    Pertinent Clinical History: [name]. is a 48 year-old male with obesity (BMI 34 kg/m^2), diabetes mellitus type 2/diabetic left foot infection(s) status-post left first toe amputation, hypertension, hyperlipidemia, Osgood-Schlatter's disease, and perianal abscess admitted to [facility] since 01/23/2024 with diabetic left foot infection/cellulitis with initial concern for osteomyelitis.  He has been afebrile.      Interim Update: The patient was evaluated by General Surgery on 01/25/2024 without plan for surgical intervention at that time.  Results of MRI left foot from 01/25/2024 subsequently became available that showed findings consistent with Charcot arthropathy with associated soft tissue swelling or cellulitis.  There is documentation the patient stated his foot looked better on 01/25/2024.  Additional details are included below.    Antimicrobials: Vancomycin  IV 01/23/2024-01/24/2024, ertapenem  from 01/24/2024 (current)    Images:  01/24/2024 -          Labs, Micro, and Imaging Reviewed:    01/23/2024 CRP 3 mg/dL  1/82/7974 WBC 85,999, hemoglobin 11.4 g/dL, hematocrit 66.0%, platelets 387,000, BUN 16 mg/dL, creatinine 8.97 mg/dL, total bilirubin 0.6 mg/dL, AST 13 U/L, ALT 13 U/L, alkaline phosphatase 74 U/L, CRP maximum/latest 3.6 mg/dL    1/85/7974 Blood cultures X 2 sets: No growth  01/24/2024 MRSA screen: Negative to date    03/28/2023 MRI left foot with/without IV contrast: Small amount of patchy marrow edema involving distal phalanx of first toe felt to be reactive.  No definitive findings of osteomyelitis or septic arthritis.  Soft tissue wound noted without organized drainable soft tissue fluid collection or tenosynovitis.  Findings of regional subcutaneous edema and suspected cellulitis of first toe.  01/23/2024 CT left lower extremity with IV contrast: Destructive changes of distal second and third metatarsals and  proximal phalanx of third toe.  This could be due to osteomyelitis.  Fragmentation and midfoot due to healing fractures and/or neuropathic joints.  Diffuse soft tissue swelling of foot that could be due to cellulitis.  No evidence of abscess.  01/25/2024 MRI left foot with/without IV contrast: Findings consistent with Charcot arthropathy.  Possibility of infection not excluded.  Associated soft tissue swelling or cellulitis.  No visible abscess.    Assessment/Recommendations:     48 YO M with obesity and DM2/prior diabetic left foot infection(s) status-post first toe amputation currently admitted with diabetic left foot infection in setting of Charcot arthropathy - will treat as a skin and soft tissue infection at this time  1. May continue ertapenem  1 g IV Q24 while hospitalized.  Inpatient alternative (if the patient's hospitalization were to end up being prolonged for any reason) is levofloxacin 750 mg PO daily and metronidazole 500 mg PO BID.  2. On hospital discharge, please transition to moxifloxacin  400 mg PO daily to total 10 days of antimicrobial therapy counting from 01/24/2024 (end date 02/02/2024).    I spent 5 minutes or more reviewing the patient's medical record, lab/micro/imaging studies, and/or medications.  Communicated with provider through Secure Chat/In [employer].  Total time >21 min.     Zada Adina Fiscal, M.D., 01/26/2024  COpAT Medical Director and Assistant Professor  Division of Infectious Diseases  [facility]

## 2024-01-26 NOTE — Nurses Notes (Signed)
 To be discharged home with family

## 2024-01-26 NOTE — Ancillary Notes (Signed)
 The MRI does not show any evidence of obvious osteomyelitis.  The patient sees his podiatrist in [place] on a regular basis and who was actually the 1 that performed his surgery.  The patient we will need to have continued antibiotic therapy as per co pat.  From a surgical standpoint that has nothing else that I would recommend other than the follow up with his podiatrist upon discharge.  The patient understood the plan of therapy and agreed to that plan.

## 2024-01-26 NOTE — Discharge Summary (Signed)
 [place] [facility]     DISCHARGE SUMMARY      PATIENT NAME:  Justin [name].   MRN:  [id number]  DOB:  January 25, 1976    INPATIENT ADMISSION DATE: 01/23/2024   DATE OF DISCHARGE:  01/26/24     ATTENDING PHYSICIAN: Eleanor JONETTA Borrow, PA-C     PRIMARY CARE PHYSICIAN: Harlene DELENA Danger, FNP     HOSPITAL PRESENTATION:    Please see full admission H&P for details.      As per HPI:    Justin [name]. 48 y.o. male          Patient states that over the past 2 weeks he noticed worsening pain in the left ankle.  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  Challenges with walking, some pain in the ankle and also up the lateral posterior portion of his left calf but stopping below-the-knee Denies any fevers denies nausea vomiting diarrhea, denies any blood in his urine or stool denies any dysuria.  Decided to get evaluated at urgent care.  They were concerned and sent him here to the ER.  Imaging concerning for potential osteomyelitis.                                                          ROS: - :  Ros - complete: Other than ROS in the HPI, all other systems were negative.  HPI Information Obtained from: patient and spouse    FURTHER HOSPITAL COURSE WITH DISCHARGE DIAGNOSES:  Patient admitted with consultation to COpAT.  Invanz  initiated.   MRI of the left foot showed findings consistent with charcot arthropathy, the possibility of infection not excluded.  There is associated soft tissue swelling or cellulitis. No visible abscess. Surgery consulted but no need for intervention.  COpAT recommends treat like soft tissue infection. Can be discharged on moxifloxacin  400mg  daily through 8/24.  He is to f/u with his PCP next week and Dr Greg Leach within the next two weeks as well. The Hospitalist personally evaluated and examined the patient in conjunction with the MLP and agree with the assessments, treatment plan and disposition of the patient as recorded by the  [facility].            Problem List:  Active Hospital Problems   (*Primary Problem)    Diagnosis    *Cellulitis    Type 2 diabetes mellitus with peripheral neuropathy (CMS HCC)    Charcot's joint of foot in type 2 diabetes mellitus (CMS HCC)    Cellulitis of left foot    Osteomyelitis of left foot        Osteomyelitis ruled out     Nutrition:    DIET NO CONCENTRATED SWEETS Do you want to initiate MNT Protocol? Yes; Calorie amount: CC 1800    Additional clinical characteristics related to nutrition:    - monitor for weight changes   - monitor intake and output    - monitor bowel functions                    PHYSICAL EXAM   DAY OF DISCHARGE:    BP 113/69   Pulse 96   Temp 36.7 C (98.1 F)   Resp 18   Ht 1.778  m (5' 10)   Wt 109 kg (240 lb 1 oz)   SpO2 95%   BMI 34.45 kg/m          General:  Patient is resting in bed, no acute distress, alert and oriented x3   Eyes:  PERRL, no scleral icterus   HENT:  Normocephalic, atraumatic, oral mucosa is moist and pink, no nasal discharge   Heart:  RRR  Lungs:  Unlabored respirations.  Lungs are clear to auscultation bilaterally, no wheezes, no rales  Abdomen:  Soft, active bowel sounds, non-tender to palpation, non-distended  Extremities:   trace edema in LLE no erythema   Skin:  Warm and dry.  Not diaphoretic  Neuro:  A&O x 3.  No focal deficits.  Speech intact.  Not tremulous  Psych:  Cooperative, not agitated    LABS:  CBC with Diff (Last 48 Hours):    Recent Results in last 48 hours     01/25/24  0431 01/26/24  0341   WBC 11.6* 14.0*   HGB 11.0* 11.4*   HCT 32.6* 33.9*   MCV 84.1 85.3   PLTCNT 378 387          Last BMP  (Last result in the past 48 hours)        Na   K   Cl   CO2   BUN   Cr   Calcium   Glucose   Glucose-Fasting        01/26/24 0341 138   4.8   109   23   16   1.02   10.0   138               Last Hepatic Panel  (Last result in the past 48 hours)        Albumin   Total PTN   Total Bili   Direct Bili   Ast/SGOT   Alt/SGPT   Alk Phos        01/26/24 0341 3.8    6.6   0.6     13   13    74                BMP (Last 48 Hours):    Recent Results in last 48 hours     01/25/24  0431 01/26/24  0341   SODIUM 138 138   POTASSIUM 4.3 4.8   CHLORIDE 108* 109*   CO2 23 23   BUN 13 16   CREATININE 0.79 1.02   CALCIUM 10.0 10.0   GLUCOSENF 88 138*          DISCHARGE MEDICATIONS:     Current Discharge Medication List        START taking these medications.        Details   moxifloxacin  400 mg Tablet  Commonly known as: AVELOX    400 mg, Oral, Daily  Qty: 7 Tablet  Refills: 0     pantoprazole  40 mg Tablet, Delayed Release (E.C.)  Commonly known as: PROTONIX   Start taking on: January 27, 2024   40 mg, Oral, DAILY  Qty: 14 Tablet  Refills: 0            CONTINUE these medications - NO CHANGES were made during your visit.        Details   atorvastatin  20 mg Tablet  Commonly known as: LIPITOR   80 mg, Oral, Daily  Refills: 0     HumaLOG  KwikPen Insulin  100 unit/mL Insulin  Pen  Generic  drug: insulin  lispro   ADMINISTER UP TO 20 UNITS UNDER THE SKIN THREE TIMES DAILY BEFORE MEALS  Refills: 0     IBU 800 mg Tablet  Generic drug: Ibuprofen   Refills: 0     lisinopriL  10 mg Tablet  Commonly known as: PRINIVIL    10 mg, Oral, Daily  Refills: 0     MEN'S MULTI-VITAMIN ORAL   Take by mouth  Refills: 0     metFORMIN 500 mg Tablet Sustained Release 24 hr  Commonly known as: GLUCOPHAGE XR   2 Tablets (1,000 mg total) Daily  Refills: 0     metoprolol  succinate 100 mg Tablet Sustained Release 24 hr  Commonly known as: TOPROL -XL   Take 1 Tablet (100 mg total) by mouth Twice daily 100 mg QAM, 50 mg QPM  Refills: 0     Mounjaro 12.5 mg/0.5 mL Pen Injector  Generic drug: tirzepatide   Refills: 0     PROBICHEW ORAL   Take by mouth  Refills: 0     Toujeo Max U-300 SoloStar 300 unit/mL (3 mL) Insulin  Pen  Generic drug: insulin  glargine U-300 conc   100 units sc daily  Refills: 0            STOP taking these medications.      montelukast  10 mg Tablet  Commonly known as: SINGULAIR                  DISCHARGE DISPOSITION:   home     DISCHARGE INSTRUCTIONS:    Follow-up Information       Deel, Harlene LABOR, FNP Follow up.    Specialty: FAMILY NURSE PRACTITIONER  Contact information:  [address]  [place] [address]  [phone]               Duremdes, Amaryllis NOVAK, MD Follow up.    Specialty: SURGERY  Why: possible osteo of left foot  Contact information:  [address]  [place] [address]  [phone]                           No discharge procedures on file.   Follow-up Information       Deel, Harlene LABOR, FNP Follow up.    Specialty: FAMILY NURSE PRACTITIONER  Contact information:  [address]  [place] [address]  [phone]               Duremdes, Amaryllis NOVAK, MD Follow up.    Specialty: SURGERY  Why: possible osteo of left foot  Contact information:  [address]  [place] [address]  [phone]                                    Copies sent to Care Team         Relationship Specialty Notifications Start End    Deel, Harlene LABOR, FNP PCP - General FAMILY NURSE PRACTITIONER  07/31/22     Phone: [phone] Fax: [phone]         [address] [place] [address]            >30 minutes total were spent coordinating discharge day today      Cortney Mckinney D Blakely Gluth, PA-C  [place] [facility] HOSPITALIST

## 2024-01-27 ENCOUNTER — Encounter (INDEPENDENT_AMBULATORY_CARE_PROVIDER_SITE_OTHER): Payer: Self-pay

## 2024-01-27 ENCOUNTER — Other Ambulatory Visit (INDEPENDENT_AMBULATORY_CARE_PROVIDER_SITE_OTHER): Payer: Self-pay

## 2024-01-27 DIAGNOSIS — Z7189 Other specified counseling: Secondary | ICD-10-CM

## 2024-01-27 NOTE — Nursing Note (Signed)
 Transition of Care Contact Information  Discharge Date: 01/26/2024  Transition Facility Type--Hospital (Inpatient or Observation)  Facility Name--[facility]  Interactive Contact(s): Completed or attempted contact indicated by Date/Time  Completed Contact: 01/27/2024 10:32 AM  Contact Method(s)-- Patient/Caregiver Telephone  Clinical Staff Name/Role who contacted--Jessie RN CTC  Transition Assessment  Discharge Summary obtained?--Yes  How are you recovering?--Improving  Discharge Meds obtained?--Yes  Discharge medication changes reviewed?--Yes  Full Medication Reconciliation Completed?--No  Medication understanding --knows new medication(s)--knows purpose of medication  Medication Concerns?--No  Have everything needed for recovery?--Yes  Care Coordination:   Patient has transition follow-up appointment date and time?--Yes  Follow up appointment date:--01/27/2024  Specialist Transition Visit planned?--Yes  Specialist Transition Visit date:--02/18/2024  Patient/caregiver plans to attend transition visit?--Yes  Primary Follow-up Barrier  Interventions provided --follow-up appointment date/time reinforced  Home Health or DME ordered at discharge?  Clinician/Team notified?  Primary reason clinician notified?  Transition Note:[facility] Transition Follow up:     CTC outbound call to patient for [facility] Discharge call.  CTC spoke with patient     Reviewed discharge instructions, medications, any DME, Home Health services, as ordered., Discussed upcoming appointment dates and times.  Reinforced the importance of keeping all provider appointments with emphasis on TOC follow up appointments. , Chart review completed based on chronic conditions and their management, risk score, readmission and/or ED visits. Reinforced to reach out to PCP or specialist for any discharge follow up questions. , Discussed and provided the Nurse Navigator 24/7 line for any questions or needs if unable to reach  PCP or specialty offices. , TCM program extended to follow and monitor transition. Will follow up in 1 week and review.     PCP confirmed: Yes. Agrees to contact PCP for non-acute symptoms during the hours of 8 a.m. - 4 p.m., PCP number provided/reinforced. Nurse Navigator toll free number, resources available, and 24/7 hours of operation provided to patient and/or caregiver if unable to reach PCP or specialty provider offices.   Additional information/assessment:  Pt reports that he has an appt with his PCP today and also another one in 10 days. Pt voices some concern about medication prescribed on discharge; mainly the potential side effects that he had read up on in the informational literature received from the pharmacy. I encouraged pt to discuss any concerns or experienced symptoms with his PCP.     Harlene Ivar Domangue, RN

## 2024-01-28 LAB — ADULT ROUTINE BLOOD CULTURE, SET OF 2 BOTTLES (BACTERIA AND YEAST)
BLOOD CULTURE, ROUTINE: NO GROWTH
BLOOD CULTURE, ROUTINE: NO GROWTH

## 2024-01-30 ENCOUNTER — Encounter (INDEPENDENT_AMBULATORY_CARE_PROVIDER_SITE_OTHER): Payer: Self-pay | Admitting: Surgery

## 2024-01-30 ENCOUNTER — Other Ambulatory Visit (HOSPITAL_COMMUNITY): Payer: Self-pay | Admitting: INTERNAL MEDICINE

## 2024-01-30 DIAGNOSIS — R0789 Other chest pain: Secondary | ICD-10-CM

## 2024-02-04 ENCOUNTER — Other Ambulatory Visit (INDEPENDENT_AMBULATORY_CARE_PROVIDER_SITE_OTHER): Payer: Self-pay

## 2024-02-04 ENCOUNTER — Ambulatory Visit (HOSPITAL_COMMUNITY)

## 2024-02-04 ENCOUNTER — Encounter (INDEPENDENT_AMBULATORY_CARE_PROVIDER_SITE_OTHER): Payer: Self-pay

## 2024-02-04 DIAGNOSIS — Z7189 Other specified counseling: Secondary | ICD-10-CM

## 2024-02-04 NOTE — Nursing Note (Signed)
 Chart review completed. RN CTC attempted post TOC Lydie Stammen check in with no avail. P[facility]O referral placed. TOC closed.     Harlene Zori Benbrook, RN

## 2024-02-04 NOTE — Nursing Note (Signed)
 Care Coordination Identified Note    DATE: 02/04/2024, 15:07  PATIENT NAME: Justin [name].  MRN: [id number]  DOB: [birth year]  PCP: Harlene DELENA Danger, FNP  INSURANCE: Payor: [employer]UE [employer] / Plan: OTHER BCBS OUT OF STATE PPO / Product Type: PPO /     Received referral for Disease Management from provider. The patient has been marked as identified at this time for potential enrollment. Chart review completed.  A future outgoing call will be made to patient within 48 hours to discuss Care Coordination and obtain verbal consent if interested.     Celina Lou, RN  02/04/2024  15:07

## 2024-02-06 ENCOUNTER — Other Ambulatory Visit (INDEPENDENT_AMBULATORY_CARE_PROVIDER_SITE_OTHER): Payer: Self-pay

## 2024-02-06 DIAGNOSIS — Z7189 Other specified counseling: Secondary | ICD-10-CM

## 2024-02-06 NOTE — Nursing Note (Signed)
 Care Coordinator    Chart Review    Chart review completed.    Outgoing call to patient to discuss possible enrollment in the [facility] CCM program.    Patient politely declined at this time.    Episode closed.    Celina Lou, RN    02/06/2024    12:04

## 2024-02-17 ENCOUNTER — Ambulatory Visit (HOSPITAL_COMMUNITY): Admission: RE | Admit: 2024-02-17 | Source: Ambulatory Visit

## 2024-02-17 ENCOUNTER — Other Ambulatory Visit: Payer: Self-pay

## 2024-02-17 ENCOUNTER — Ambulatory Visit
Admission: RE | Admit: 2024-02-17 | Discharge: 2024-02-17 | Disposition: A | Discharge status: Home / Self Care | Source: Ambulatory Visit | Attending: INTERNAL MEDICINE

## 2024-02-17 DIAGNOSIS — R0789 Other chest pain: Secondary | ICD-10-CM | POA: Insufficient documentation

## 2024-02-17 MED ORDER — REGADENOSON 0.4 MG/5 ML INTRAVENOUS SYRINGE
0.4000 mg | INJECTION | Freq: Once | INTRAVENOUS | Status: AC
Start: 2024-02-17 — End: 2024-02-17
  Administered 2024-02-17: 0.4 mg via INTRAVENOUS
  Filled 2024-02-17: qty 5

## 2024-02-18 ENCOUNTER — Encounter (INDEPENDENT_AMBULATORY_CARE_PROVIDER_SITE_OTHER): Payer: Self-pay | Admitting: Surgery

## 2024-02-18 ENCOUNTER — Ambulatory Visit (INDEPENDENT_AMBULATORY_CARE_PROVIDER_SITE_OTHER): Payer: Self-pay | Admitting: Surgery

## 2024-02-18 VITALS — BP 133/81 | HR 109 | Wt 240.0 lb

## 2024-02-18 DIAGNOSIS — L02612 Cutaneous abscess of left foot: Secondary | ICD-10-CM

## 2024-02-18 DIAGNOSIS — L03116 Cellulitis of left lower limb: Secondary | ICD-10-CM

## 2024-02-18 DIAGNOSIS — L02619 Cutaneous abscess of unspecified foot: Secondary | ICD-10-CM

## 2024-02-20 ENCOUNTER — Encounter (INDEPENDENT_AMBULATORY_CARE_PROVIDER_SITE_OTHER): Payer: Self-pay | Admitting: Surgery

## 2024-02-20 NOTE — Progress Notes (Signed)
 GENERAL SURGERY, [facility]  [address]  [place] [address]    Progress Note    Name: Bertran Zeimet. MRN:  [id number]   Date: 02/18/2024 DOB:  18-Jun-1975 (48 y.o.)                Date of Service:  02/20/2024  Jearline Camellia LITTIE Mickey., 47 y.o. male  Date of Birth:  04-15-1976  PCP: Harlene DELENA Danger, FNP  Referring:  No ref. provider found     HPI:  Jaymz Traywick. is a 48 y.o. White male who returns for follow up after being hospitalized for cellulitis of the left lower extremity.  The patient states that has done well since discharge.          Past Medical History:   Diagnosis Date    Amputated great toe     Atherosclerosis     Diabetes mellitus, type 2     HLD (hyperlipidemia)     Hypertension     Intestinal hemorrhage     OCD (obsessive compulsive disorder)     Osgood-Schlatter's disease     Perianal abscess     Torsion of epididymis       Past Surgical History:   Procedure Laterality Date    COLONOSCOPY      CYSTECTOMY      HX APPENDECTOMY      LAPOROTOMY    SURGERY SCROTAL / TESTICULAR      TOE AMPUTATION Left     great toe      No outpatient medications have been marked as taking for the 02/18/24 encounter (Office Visit) with Albirtha Grinage B, MD.      Allergies[1]        BP 133/81   Pulse (!) 109   Wt 109 kg (240 lb)   SpO2 96%   BMI 34.44 kg/m          General: appropriate for age. in no acute distress.    Vital signs are present above and have been reviewed by me     HEENT: Atraumatic, Normocephalic.    Lungs: Nonlabored breathing with symmetric expansion    Heart:Regular wth respect to rate and rythmn.    Abdomen:Soft. Nontender. Nondistended and benign     Examination of the left lower extremity and foot shows that the cellulitis has not improved and the wound is healed.    Psychiatric: Alert and oriented to person, place, and time. affect appropriate       Assessment/Plan:  Assessment/Plan   1. Cellulitis and abscess of foot         The patient is instructed to  continue good local care and return to clinic p.r.n..  He will follow up with his family doctor.    Return if symptoms worsen or fail to improve.     This note was partially created using voice recognition software and is inherently subject to errors including those of syntax and sound alike  substitutions which may escape proof reading. In such instances, original meaning may be extrapolated by contextual derivation.    Tyee Vandevoorde B Verne Cove, MD,MBA,FACS       [1] No Known Allergies

## 2024-02-21 DIAGNOSIS — R0789 Other chest pain: Secondary | ICD-10-CM

## 2024-02-21 LAB — MYOCARDIAL PERFUSION COMPLETE: Nuc Stress EF: 64 %

## 2024-03-18 ENCOUNTER — Other Ambulatory Visit: Payer: Self-pay

## 2024-03-18 ENCOUNTER — Other Ambulatory Visit (HOSPITAL_BASED_OUTPATIENT_CLINIC_OR_DEPARTMENT_OTHER): Payer: Self-pay | Admitting: Specialist

## 2024-03-18 DIAGNOSIS — M14672 Charcot's joint, left ankle and foot: Secondary | ICD-10-CM

## 2024-03-18 DIAGNOSIS — M14671 Charcot's joint, right ankle and foot: Secondary | ICD-10-CM

## 2024-03-19 ENCOUNTER — Ambulatory Visit (HOSPITAL_BASED_OUTPATIENT_CLINIC_OR_DEPARTMENT_OTHER)
Admission: RE | Admit: 2024-03-19 | Discharge: 2024-03-19 | Disposition: A | Discharge status: Home / Self Care | Source: Ambulatory Visit | Admitting: Radiology

## 2024-03-19 ENCOUNTER — Other Ambulatory Visit (HOSPITAL_BASED_OUTPATIENT_CLINIC_OR_DEPARTMENT_OTHER): Payer: Self-pay | Admitting: Specialist

## 2024-03-19 ENCOUNTER — Ambulatory Visit: Attending: Specialist | Admitting: Specialist

## 2024-03-19 ENCOUNTER — Encounter (HOSPITAL_BASED_OUTPATIENT_CLINIC_OR_DEPARTMENT_OTHER): Payer: Self-pay | Admitting: Specialist

## 2024-03-19 VITALS — Ht 70.0 in

## 2024-03-19 DIAGNOSIS — Z01818 Encounter for other preprocedural examination: Secondary | ICD-10-CM | POA: Insufficient documentation

## 2024-03-19 DIAGNOSIS — M14671 Charcot's joint, right ankle and foot: Secondary | ICD-10-CM

## 2024-03-19 DIAGNOSIS — M14672 Charcot's joint, left ankle and foot: Secondary | ICD-10-CM | POA: Insufficient documentation

## 2024-03-19 DIAGNOSIS — E11610 Type 2 diabetes mellitus with diabetic neuropathic arthropathy: Secondary | ICD-10-CM

## 2024-03-19 DIAGNOSIS — E0822 Diabetes mellitus due to underlying condition with diabetic chronic kidney disease: Secondary | ICD-10-CM | POA: Insufficient documentation

## 2024-03-19 DIAGNOSIS — E1140 Type 2 diabetes mellitus with diabetic neuropathy, unspecified: Secondary | ICD-10-CM

## 2024-03-20 NOTE — H&P (Signed)
 PATIENT NAME: Justin [name], Justin [name]  HOSPITAL NUMBER:  [id number]  DATE OF SERVICE: 03/19/2024  DATE OF BIRTH:  Nov 15, 1975    HISTORY AND PHYSICAL    HISTORY OF PRESENT ILLNESS:  Justin [name] is a pleasant 48 year old male who unfortunately has a long history of diabetes and neuropathy.  He was having some pain and swelling on the left foot.  Initially was treated conservatively.  Unfortunately, as time has gone on he had a complete midfoot fracture dislocation developing a rocker deformity of the left foot.  He is noticing some symptoms on the right foot now that he felt like similar when they were on the left foot.  He basically was sent here by his podiatrist in his area because of basically a complete fracture dislocation of the left foot.  On the right foot he has been noticing increasing swelling and warmth, however, he has not noticed any deformities.  He is now here for my opinion and my evaluation.    Past medical history, past surgical history, family history, social history, medications, allergies and review of systems were gone over with the patient and noted on the chart.    PHYSICAL EXAMINATION:  Throughout the physical examination, the patient was noted to be alert, awake, oriented and relaxed. Examination of ankle jerk reflexes was within normal limits. Tests of coordination reveal normal rapid alternating movements. Inspection of the foot reveals no significant atrophy. Normal tone was noted. Lower extremities were without significant malalignment. Sensation was intact throughout. Strength was tested in four planes and was within normal limits. The patient has good dorsalis pedis and tibialis posterior pulses. The toes are pink and warm with less than capillary refill.  He has warmth and swelling over the right foot.  He has good dorsalis pedis and tibialis posterior pulses.  The toes are pink and warm with brisk capillary refill.  He has no open sores on the right foot/    Today, I looked at his left foot.  He  has a previous great toe amputation.  He does have warmth and swelling with a clear rocker deformity of the left foot.    IMAGING:  Today I got x-rays, 3 views of bilateral feet and ankles, which I personally reviewed.  The x-rays on the right foot   just shows some mild early changes of Charcot.  However, he has excellent alignment and no evidence of significant fragmentation.     Recent Results (from the past 720 hours)   XR FEET WT BEARING BILATERAL     Status: None    Narrative    Christoph [name] Bangs JR.    PROCEDURE DESCRIPTION: XR FEET WT BEARING BILATERAL    CLINICAL INDICATION: F85.327: Charcot joint of left foot  M14.671: Charcot joint of right foot    TECHNIQUE: 3 views / 6 images submitted.    COMPARISON: CT 01/23/2024      FINDINGS: 3 views weightbearing right foot show mild degenerative changes at the toes. Moderate DJD is present at the mid foot and there is forefoot soft tissue swelling. Pes planus noted with no acute fracture.    The left foot exhibits first toe amputation with evidence for destructive changes at the second and third metatarsal phalangeal joints as well as the mid foot. There is midfoot dislocation consistent with neuropathic joint along with some fragmentation.    There is some air in the soft tissues lateral to the head of the first metatarsal. Infection not excluded.  Impression    Changes of osteomyelitis at the second and third metatarsal phalangeal joints. Mid foot neuropathic arthropathy is similar to the recent CT exam. Soft tissue swelling noted. Air is noted in the soft tissues lateral to the head of the first metatarsal.            Radiologist location ID: [id number]     XR ANKLES BILATERAL     Status: None    Narrative    Ishmeal [name] Robak JR.    PROCEDURE DESCRIPTION: XR ANKLES BILATERAL    CLINICAL INDICATION: F85.327: Charcot joint of left foot  M14.671: Charcot joint of right foot    TECHNIQUE: 3 views / 6 images submitted.    COMPARISON: No prior studies were  compared.      FINDINGS: 3 views right ankle show pes planus and advanced degenerative arthrosis at the mid foot. No fracture or bony destruction is evident.    3 views weightbearing left ankle show dislocation at the mid foot with neuropathic joint and some bony fragmentation. Soft tissue swelling noted on the left.      Impression    Degenerative changes bilaterally. Neuropathic joint with fragmentation and dislocation at the mid foot on the left.                Radiologist location ID: [id number]          X-rays, 3 views of the left foot show a complete dislocation at the naviculocuneiform region of the midfoot with fractures of the second and third  consistent with complete dislocation of that side.    ASSESSMENT AND PLAN:  I had a long talk with him.  I told him unfortunately he has a Charcot fracture dislocation now on the left foot.  We are going to try to salvage it.  Basically it will be an open reduction internal fixation of the Lisfranc dislocations and fractures.  At the same time, we will have to perform fusion of the medial column of the foot.  I explained to him the procedure.  We will use a midfoot nail to stabilize it.  I told him he will be nonweightbearing on the left foot for 6 weeks while it heals.  I answered all his questions.  We will proceed with the surgery sometime in the future.  For now, he is going to continue with a boot with a Peg insert on the right  for the Charcot changes on the right foot.    The surgery was discussed at length with the patient. Alternatives to treatment were discussed as well. The risks of the planned procedure include, but are not limited to the possibility of infection, nerve damage, tendon damage, wound healing problems, incomplete relief of pain, and need for specialized footwear in the future. The risks of smoking were discussed as well. The postoperative course was discussed with the patient. Informed consent was obtained. All questions were answered, and  we told the patient to call before surgery if they had any questions about the operation.  Orders placed for CBC, BMP/CMP and EKG.        Lupita Skene, MD                DD:  03/19/2024 16:48:20  DT:  03/20/2024 15:33:44 [place]  D#:  [id number]

## 2024-03-25 ENCOUNTER — Encounter (HOSPITAL_BASED_OUTPATIENT_CLINIC_OR_DEPARTMENT_OTHER): Payer: Self-pay | Admitting: Specialist

## 2024-03-25 ENCOUNTER — Other Ambulatory Visit (HOSPITAL_COMMUNITY): Payer: Self-pay | Admitting: Specialist

## 2024-03-25 ENCOUNTER — Ambulatory Visit: Attending: Specialist

## 2024-03-25 ENCOUNTER — Ambulatory Visit
Admission: RE | Admit: 2024-03-25 | Discharge: 2024-03-25 | Disposition: A | Source: Ambulatory Visit | Attending: Specialist

## 2024-03-25 ENCOUNTER — Other Ambulatory Visit: Payer: Self-pay

## 2024-03-25 ENCOUNTER — Other Ambulatory Visit (HOSPITAL_BASED_OUTPATIENT_CLINIC_OR_DEPARTMENT_OTHER): Payer: Self-pay | Admitting: Specialist

## 2024-03-25 DIAGNOSIS — M14672 Charcot's joint, left ankle and foot: Secondary | ICD-10-CM

## 2024-03-25 DIAGNOSIS — Z01818 Encounter for other preprocedural examination: Secondary | ICD-10-CM | POA: Insufficient documentation

## 2024-03-25 DIAGNOSIS — E0822 Diabetes mellitus due to underlying condition with diabetic chronic kidney disease: Secondary | ICD-10-CM | POA: Insufficient documentation

## 2024-03-25 LAB — VITAMIN D 25 TOTAL: VITAMIN D 25, TOTAL: 29.61 ng/mL — ABNORMAL LOW (ref 30.00–100.00)

## 2024-03-25 LAB — BASIC METABOLIC PANEL
ANION GAP: 8 mmol/L (ref 4–13)
BUN/CREA RATIO: 18 (ref 6–22)
BUN: 17 mg/dL (ref 7–25)
CALCIUM: 11.1 mg/dL — ABNORMAL HIGH (ref 8.6–10.3)
CHLORIDE: 108 mmol/L — ABNORMAL HIGH (ref 98–107)
CO2 TOTAL: 27 mmol/L (ref 21–31)
CREATININE: 0.94 mg/dL (ref 0.60–1.30)
ESTIMATED GFR: 101 mL/min/1.73mˆ2 (ref >59)
GLUCOSE: 81 mg/dL (ref 74–109)
OSMOLALITY, CALCULATED: 286 mosm/kg (ref 270–290)
POTASSIUM: 5.1 mmol/L (ref 3.5–5.1)
SODIUM: 143 mmol/L (ref 136–145)

## 2024-03-25 LAB — HGA1C (HEMOGLOBIN A1C WITH EST AVG GLUCOSE): HEMOGLOBIN A1C: 6.2 % — ABNORMAL HIGH (ref 4.0–6.0)

## 2024-03-25 LAB — CBC WITH DIFF
BASOPHIL #: 0 x10ˆ3/uL (ref 0.00–0.10)
BASOPHIL %: 0 % (ref 0–1)
EOSINOPHIL #: 0.3 x10ˆ3/uL (ref 0.00–0.60)
EOSINOPHIL %: 3 % (ref 1–8)
HCT: 36.9 % (ref 36.7–47.1)
HGB: 12.5 g/dL (ref 12.5–16.3)
LYMPHOCYTE #: 2.7 x10ˆ3/uL (ref 1.00–3.00)
LYMPHOCYTE %: 26 % (ref 15–43)
MCH: 28.2 pg (ref 23.8–33.4)
MCHC: 34 g/dL (ref 32.5–36.3)
MCV: 83.1 fL (ref 73.0–96.2)
MONOCYTE #: 0.8 x10ˆ3/uL (ref 0.30–1.10)
MONOCYTE %: 8 % (ref 6–14)
MPV: 6.7 fL — ABNORMAL LOW (ref 7.4–11.4)
NEUTROPHIL #: 6.5 x10ˆ3/uL (ref 1.85–7.84)
NEUTROPHIL %: 63 % (ref 44–74)
PLATELETS: 405 x10ˆ3/uL (ref 140–440)
RBC: 4.44 x10ˆ6/uL (ref 4.06–5.63)
RDW: 14.4 % (ref 12.1–16.2)
WBC: 10.3 x10ˆ3/uL — ABNORMAL HIGH (ref 3.6–10.2)

## 2024-03-25 MED ORDER — CHOLECALCIFEROL (VITAMIN D3) 1,250 MCG (50,000 UNIT) CAPSULE: 50000.0000 [IU] | ORAL_CAPSULE | ORAL | 0 refills | Status: DC

## 2024-03-26 DIAGNOSIS — Z01810 Encounter for preprocedural cardiovascular examination: Secondary | ICD-10-CM

## 2024-03-26 DIAGNOSIS — R9431 Abnormal electrocardiogram [ECG] [EKG]: Secondary | ICD-10-CM

## 2024-03-26 LAB — ECG 12 LEAD
Atrial Rate: 100 {beats}/min
Calculated P Axis: 25 degrees
Calculated R Axis: 6 degrees
Calculated T Axis: 35 degrees
PR Interval: 154 ms
QRS Duration: 76 ms
QT Interval: 330 ms
QTC Calculation: 425 ms
Ventricular rate: 100 {beats}/min

## 2024-04-06 ENCOUNTER — Encounter (HOSPITAL_COMMUNITY): Payer: Self-pay | Admitting: Specialist

## 2024-04-07 ENCOUNTER — Encounter (HOSPITAL_BASED_OUTPATIENT_CLINIC_OR_DEPARTMENT_OTHER): Payer: Self-pay | Admitting: Specialist

## 2024-04-17 ENCOUNTER — Encounter (HOSPITAL_BASED_OUTPATIENT_CLINIC_OR_DEPARTMENT_OTHER): Payer: Self-pay | Admitting: Specialist

## 2024-04-20 ENCOUNTER — Telehealth (HOSPITAL_BASED_OUTPATIENT_CLINIC_OR_DEPARTMENT_OTHER): Payer: Self-pay | Admitting: Specialist

## 2024-04-20 NOTE — Telephone Encounter (Signed)
 Spoke to patient directly. Patient notified of the following information for their surgery on 04/21/24:    - Surgery is scheduled for 1030. Please arrive to the hospital on the 2nd floor at 0830 (2hrs prior to surgical time).   - Must have transportation home after surgery.   - Nothing to eat or drink after MIDNIGHT. This includes no smoking, no tobacco products, no vaping, no gum. NO GATORADE/ELECTROLYTE DRINK.    Patient stated understanding and had no further questions.       Damien Bracket, BSN, RN   Orthopaedic Nurse Navigator  [facility]

## 2024-04-20 NOTE — Discharge Instructions (Signed)
 MAJOR FOOT/ANKLE PROCEDURE   WHAT TO EXPECT AT HOME  DR. HASSELMAN     YOUR RECOVERY     This care sheet is a general guide on how to best care for yourself following surgery. Everyone recovers at a different pace, however, following the steps below will help to ensure you recover as quickly and safely as possible. Please note, it is normal to expect your ankle/foot to feel stiff and sore around your incision areas after surgery.     INSTRUCTIONS & CARE AT HOME:     Follow up care is a key part of your treatment and safety. Be sure to schedule and attend all appointments. Please call your doctor if you are experiencing problems.      ACTIVITY          NON-WEIGHTBEARING on the operative leg. Must use crutches, walker or knee scooter at all times for the first 2-3 weeks after surgery.          Strict elevation of operative leg above heart/hip level at all times with the exception of meals and when you are using the bathroom.           Elevation helps to reduce both pain and swelling and is a key factor in your recovery.           NO DRIVING     DIET           Resume your regular diet as tolerated.          Drink plenty of water.           High fiber foods, such as fresh fruit and green vegetables, help reduce constipation that may occur from taking pain medications.      MEDICATIONS           Resume your regular medications as instructed.           Take pain medication only as prescribed. See below for details.           For constipation, you may take an over-the-counter stool softener (such as MiraLAX) as needed.           IMPORTANT while Non-Weightbearing: If you are not already taking anticoagulants/blood thinners such as Coumadin, Pradaxa, Eliquis, Xarelto, Lovenox, Arixtra, etc.- then you must take a baby aspirin 81 mg twice a day until your first post-op visit to help prevent blood clots.     Please note: We will not refill pain medication on the weekend or after 3 pm Monday-Thursday or after 12 noon  on Fridays     INCISION CARE           It is normal to have some bloody drainage on your dressing and/or splint postoperatively. Please call if you notice continuous, increased, or unusual (color/smell) drainage.           Keep your post op dressing/splint clean, dry and intact until your first post op appointment which is usually 3 weeks after surgery. DO NOT REMOVE.           DO NOT get your dressing/splint wet or insert anything into it.      Please call the office with any questions or concerns regarding your dressing/splint.     EXERCISE          You will not start any formal therapy/exercise until your surgeon instructs you to do so.      ICE AND ELEVATION  While awake, please use ice packs behind the knee continuously for the first 24-72hrs and keep your dressing/splint dry.           After this period, you may continue to ice behind the knee throughout the day to help reduce pain and swelling.           Elevation helps to reduce both pain and swelling and is an important factor in your recovery.      OTHER INSTRUCTIONS          No alcoholic beverages 24hrs after surgery or while taking pain medication.           No driving or operating complex machinery until advised to do so or while taking pain medication.           Do not make important decisions for 24 hours after surgery or while taking pain medications.     IV STICK/BRUISING IN ARM(s): Not every patient will experience this- it depends on the procedure that was performed! The needlestick was performed in the OR (while asleep) to collect a small amount of your blood that was spun down to separate into PRP (platelet rich plasma). Your own PRP was given back to you to help stimulate your body's natural healing process and bone re-growth. Often times, this may require multiple needlestick's to find a good vein since your body has been dehydrated from fasting before surgery. If you do not have any needlestick then your procedure did not  require the use of PRP.      WHEN TO CALL FOR HELP?     CALL 911- ANYTIME you think you may need emergency care. Call or seek immediate medical attention if:          You passed out (lost consciousness).          You have chest pain, are short of breath, or you cough up blood.      CALL YOUR SURGEON- NOW or seek immediate medical care if:           Chills or fever of 102 degrees or above.          New, excessive, or unusual drainage on your dressing/splint.          Swelling, pain, redness, and/or warmth in the calf of your operative leg.           Uncontrollable pain that is not relieved by taking prescribed pain medication, elevating your leg, or applying ice.           Any increased discoloration, numbness or tingling in the operative leg. (You will see your toes/foot/ankle will go reddish/purple if the foot is hanging down- this is a normal phenomenon. Elevate the leg for 15 minutes and this should go away).          After you are permitted to change your dressing, please call if you experience any redness, swelling or draining from incisions.      Call our office at [phone] during office hours. After hours, call the [facility] at [phone]     Please note: We will not refill pain medication on the weekend or after 3 pm Monday-Thursday or after 12 noon on Fridays    POST-SURGERY PAIN RELIEF  Dr. Edison     Discomfort/pain is a normal and expected outcome of surgery. The goal is to minimize pain. Please understand a pain level of 0 is not a realistic goal immediately following surgery.  You may take Acetaminophen  (Tylenol ) 1000mg  3 times a day. We prefer you do not take NSAIDs, such as Ibuprofen, for 10 days after surgery (unless we have prescribed you Mobic). After 10 days, consider adding over-the-counter Ibuprofen (Advil/Motrin) in between the Tylenol  doses as needed, in 3-hour increments (i.e. Take Tylenol  and wait 3 hours, then take Advil/Motrin/Mobic and wait 3 hours, repeat). If  you are without significant pain, medications can be taken less frequently. Additionally, do not take NSAIDs/Ibuprofen if you are on a prescribed blood thinner such as Coumadin, Pradaxa, Eliquis, Xarelto, Lovenox, or Arixtra. Do not take any additional NSAIDs/Ibuprofen if you have been prescribed Mobic. You may take ibuprofen while on a low-dose Aspirin.   For severe pain, a narcotic/opioid medicine may be prescribed for a short period of time (usually Oxycodone/Roxicodone). Please take all narcotic medications only as prescribed. Tylenol  may be taken with Oxycodone since it does not contain acetaminophen  in it. IF you are prescribed a narcotic that already contains acetaminophen  in it (Percocet/Vicodin), do not take additional Tylenol  with it. There are no negative interactions between the prescribed narcotics and ibuprofen, so they can be taken at the same time. You may also be prescribed Gabapentin  (Neurontin ) 300mg  3 times a day for a longer period. This is not a narcotic and is prescribed to help manage pain and reduce narcotic/opioid use.   Our referred method for minimizing post-operative discomfort is to take Gabapentin  and alternate Tylenol  and Ibuprofen (as applicable) every 3 hours. The narcotic pain medication should be used for supplement breakthrough pain, not as the primary method of pain relief. Additionally, please adhere to a strict schedule of elevating and ice behind the knee as this is an essential step in pain relief. Pain or discomfort following surgery should progressively subside with each day, however, if you received a regional nerve block you may not begin noticing pain until the block wears off (usually 1-7 days after surgery). This is normal, please follow the pain management routine advised above.  If, however, you experience uncontrollable pain, please call our office at [phone] during office hours. After hours, call the [facility] at [phone].    Please note: We will  not refill pain medication on the weekend or after 3 pm Monday-Thursday or after 12 noon on Fridays.

## 2024-04-21 ENCOUNTER — Ambulatory Visit (HOSPITAL_COMMUNITY)

## 2024-04-21 ENCOUNTER — Other Ambulatory Visit: Payer: Self-pay

## 2024-04-21 ENCOUNTER — Ambulatory Visit (HOSPITAL_COMMUNITY): Admitting: Specialist

## 2024-04-21 ENCOUNTER — Ambulatory Visit
Admission: RE | Admit: 2024-04-21 | Discharge: 2024-04-21 | Disposition: A | Discharge status: Home / Self Care | Source: Ambulatory Visit | Attending: Specialist | Admitting: Specialist

## 2024-04-21 ENCOUNTER — Other Ambulatory Visit (HOSPITAL_BASED_OUTPATIENT_CLINIC_OR_DEPARTMENT_OTHER): Payer: Self-pay | Admitting: Specialist

## 2024-04-21 ENCOUNTER — Encounter (HOSPITAL_COMMUNITY)
Admission: RE | Disposition: A | Discharge status: Home / Self Care | Payer: Self-pay | Source: Ambulatory Visit | Attending: Specialist

## 2024-04-21 ENCOUNTER — Encounter (HOSPITAL_COMMUNITY): Payer: Self-pay | Admitting: Specialist

## 2024-04-21 ENCOUNTER — Ambulatory Visit (HOSPITAL_COMMUNITY): Admission: RE | Admit: 2024-04-21 | Discharge: 2024-04-21 | Disposition: A | Source: Ambulatory Visit

## 2024-04-21 DIAGNOSIS — S93315A Dislocation of tarsal joint of left foot, initial encounter: Secondary | ICD-10-CM | POA: Insufficient documentation

## 2024-04-21 DIAGNOSIS — E11610 Type 2 diabetes mellitus with diabetic neuropathic arthropathy: Secondary | ICD-10-CM | POA: Insufficient documentation

## 2024-04-21 DIAGNOSIS — Z7985 Long-term (current) use of injectable non-insulin antidiabetic drugs: Secondary | ICD-10-CM | POA: Insufficient documentation

## 2024-04-21 DIAGNOSIS — M25375 Other instability, left foot: Secondary | ICD-10-CM | POA: Insufficient documentation

## 2024-04-21 DIAGNOSIS — Z87891 Personal history of nicotine dependence: Secondary | ICD-10-CM | POA: Insufficient documentation

## 2024-04-21 DIAGNOSIS — M19072 Primary osteoarthritis, left ankle and foot: Secondary | ICD-10-CM

## 2024-04-21 DIAGNOSIS — R52 Pain, unspecified: Secondary | ICD-10-CM

## 2024-04-21 DIAGNOSIS — M25562 Pain in left knee: Secondary | ICD-10-CM

## 2024-04-21 DIAGNOSIS — S93315S Dislocation of tarsal joint of left foot, sequela: Secondary | ICD-10-CM

## 2024-04-21 DIAGNOSIS — Z7982 Long term (current) use of aspirin: Secondary | ICD-10-CM | POA: Insufficient documentation

## 2024-04-21 DIAGNOSIS — M24572 Contracture, left ankle: Secondary | ICD-10-CM | POA: Insufficient documentation

## 2024-04-21 DIAGNOSIS — M899 Disorder of bone, unspecified: Secondary | ICD-10-CM | POA: Insufficient documentation

## 2024-04-21 DIAGNOSIS — Z794 Long term (current) use of insulin: Secondary | ICD-10-CM | POA: Insufficient documentation

## 2024-04-21 DIAGNOSIS — Z4889 Encounter for other specified surgical aftercare: Secondary | ICD-10-CM

## 2024-04-21 DIAGNOSIS — M25572 Pain in left ankle and joints of left foot: Secondary | ICD-10-CM

## 2024-04-21 DIAGNOSIS — I10 Essential (primary) hypertension: Secondary | ICD-10-CM | POA: Insufficient documentation

## 2024-04-21 DIAGNOSIS — R000 Tachycardia, unspecified: Secondary | ICD-10-CM | POA: Insufficient documentation

## 2024-04-21 DIAGNOSIS — R2689 Other abnormalities of gait and mobility: Secondary | ICD-10-CM

## 2024-04-21 DIAGNOSIS — G9782 Other postprocedural complications and disorders of nervous system: Secondary | ICD-10-CM

## 2024-04-21 DIAGNOSIS — Z7984 Long term (current) use of oral hypoglycemic drugs: Secondary | ICD-10-CM | POA: Insufficient documentation

## 2024-04-21 DIAGNOSIS — G8918 Other acute postprocedural pain: Secondary | ICD-10-CM

## 2024-04-21 DIAGNOSIS — R200 Anesthesia of skin: Secondary | ICD-10-CM

## 2024-04-21 HISTORY — DX: Dependence on other enabling machines and devices: Z99.89

## 2024-04-21 HISTORY — DX: Sleep apnea, unspecified: G47.30

## 2024-04-21 LAB — POC BLOOD GLUCOSE (RESULTS): GLUCOSE, POC: 120 mg/dL — ABNORMAL HIGH (ref 70–110)

## 2024-04-21 SURGERY — ARTHRODESIS FOOT
Anesthesia: Monitor Anesthesia Care | Site: Foot | Laterality: Left | Wound class: Clean Wound: Uninfected operative wounds in which no inflammation occurred

## 2024-04-21 MED ORDER — BUPIVACAINE (PF) 0.5 % (5 MG/ML) INJECTION SOLUTION
Freq: Once | INTRAMUSCULAR | Status: AC | PRN
Start: 2024-04-21 — End: 2024-04-21
  Administered 2024-04-21: 10 mL

## 2024-04-21 MED ORDER — BUPIVACAINE LIPOSOME(PF) 1.3 %(13.3 MG/ML) SUSPENSION FOR INFILTRATION
10.0000 mL | Freq: Once | Status: DC
Start: 2024-04-21 — End: 2024-04-21
  Filled 2024-04-21: qty 10

## 2024-04-21 MED ORDER — ONDANSETRON 4 MG DISINTEGRATING TABLET
4.0000 mg | ORAL_TABLET | Freq: Three times a day (TID) | ORAL | 0 refills | Status: AC | PRN
Start: 2024-04-21 — End: ?
  Filled 2024-04-21: qty 10, 4d supply, fill #0

## 2024-04-21 MED ORDER — PROPOFOL 10 MG/ML IV BOLUS
INJECTION | INTRAVENOUS | Status: AC
Start: 2024-04-21 — End: 2024-04-21
  Filled 2024-04-21: qty 40

## 2024-04-21 MED ORDER — KETAMINE 10 MG/ML INJECTION WRAPPER
Freq: Once | INTRAMUSCULAR | Status: DC | PRN
Start: 2024-04-21 — End: 2024-04-21
  Administered 2024-04-21: 20 mg via INTRAVENOUS
  Administered 2024-04-21: 30 mg via INTRAVENOUS

## 2024-04-21 MED ORDER — FENTANYL (PF) 50 MCG/ML INJECTION WRAPPER
25.0000 ug | INJECTION | INTRAMUSCULAR | Status: DC | PRN
Start: 2024-04-21 — End: 2024-04-21

## 2024-04-21 MED ORDER — ONDANSETRON HCL (PF) 4 MG/2 ML INJECTION SOLUTION
4.0000 mg | Freq: Once | INTRAMUSCULAR | Status: DC | PRN
Start: 2024-04-21 — End: 2024-04-21

## 2024-04-21 MED ORDER — ACETAMINOPHEN 500 MG TABLET
500.0000 mg | ORAL_TABLET | Freq: Three times a day (TID) | ORAL | Status: AC
Start: 2024-04-21 — End: 2024-05-21

## 2024-04-21 MED ORDER — GABAPENTIN 300 MG CAPSULE
300.0000 mg | ORAL_CAPSULE | Freq: Three times a day (TID) | ORAL | 0 refills | Status: AC
Start: 2024-04-21 — End: 2024-05-21
  Filled 2024-04-21: qty 90, 30d supply, fill #0

## 2024-04-21 MED ORDER — GLYCOPYRROLATE 0.2 MG/ML INJECTION SOLUTION
Freq: Once | INTRAMUSCULAR | Status: DC | PRN
Start: 2024-04-21 — End: 2024-04-21
  Administered 2024-04-21: .2 mg via INTRAVENOUS

## 2024-04-21 MED ORDER — SODIUM CHLORIDE 0.9 % (FLUSH) INJECTION SYRINGE
3.0000 mL | INJECTION | Freq: Three times a day (TID) | INTRAMUSCULAR | Status: DC
Start: 2024-04-21 — End: 2024-04-21

## 2024-04-21 MED ORDER — HYDROMORPHONE (PF) 0.5 MG/0.5 ML INJECTION SYRINGE
0.5000 mg | INJECTION | INTRAMUSCULAR | Status: DC | PRN
Start: 2024-04-21 — End: 2024-04-21

## 2024-04-21 MED ORDER — ASPIRIN 81 MG TABLET,DELAYED RELEASE
81.0000 mg | DELAYED_RELEASE_TABLET | Freq: Two times a day (BID) | ORAL | Status: AC
Start: 2024-04-21 — End: 2024-05-21

## 2024-04-21 MED ORDER — MELOXICAM 15 MG TABLET
15.0000 mg | ORAL_TABLET | Freq: Once | ORAL | Status: AC
Start: 2024-04-21 — End: 2024-04-21
  Administered 2024-04-21: 15 mg via ORAL
  Filled 2024-04-21: qty 1

## 2024-04-21 MED ORDER — PROPOFOL 10 MG/ML IV BOLUS
INJECTION | Freq: Once | INTRAVENOUS | Status: DC | PRN
Start: 2024-04-21 — End: 2024-04-21
  Administered 2024-04-21: 50 mg via INTRAVENOUS

## 2024-04-21 MED ORDER — DEXTROSE 5% IN WATER (D5W) FLUSH BAG - 250 ML
INTRAVENOUS | Status: DC | PRN
Start: 2024-04-21 — End: 2024-04-21

## 2024-04-21 MED ORDER — PROPOFOL 10 MG/ML IV - CHI
INTRAVENOUS | Status: DC | PRN
Start: 2024-04-21 — End: 2024-04-21
  Administered 2024-04-21 (×2): 120 ug/kg/min via INTRAVENOUS
  Administered 2024-04-21: 100 ug/kg/min via INTRAVENOUS
  Administered 2024-04-21: 150 ug/kg/min via INTRAVENOUS

## 2024-04-21 MED ORDER — GABAPENTIN 300 MG CAPSULE
300.0000 mg | ORAL_CAPSULE | Freq: Once | ORAL | Status: AC
Start: 2024-04-21 — End: 2024-04-21
  Administered 2024-04-21: 300 mg via ORAL
  Filled 2024-04-21: qty 1

## 2024-04-21 MED ORDER — LACTATED RINGERS INTRAVENOUS SOLUTION
INTRAVENOUS | Status: DC
Start: 2024-04-21 — End: 2024-04-21

## 2024-04-21 MED ORDER — CEFAZOLIN 2 GRAM INJECTION - 100MG/ML FOR IV PUSH
2.0000 g | Freq: Once | INTRAMUSCULAR | Status: AC
Start: 2024-04-21 — End: 2024-04-21
  Administered 2024-04-21: 2 g via INTRAVENOUS

## 2024-04-21 MED ORDER — SODIUM CHLORIDE 0.9 % (FLUSH) INJECTION SYRINGE
20.0000 mL | INJECTION | Freq: Once | INTRAMUSCULAR | Status: DC | PRN
Start: 2024-04-21 — End: 2024-04-21

## 2024-04-21 MED ORDER — SODIUM CHLORIDE 0.9 % (FLUSH) INJECTION SYRINGE
3.0000 mL | INJECTION | INTRAMUSCULAR | Status: DC | PRN
Start: 2024-04-21 — End: 2024-04-21

## 2024-04-21 MED ORDER — HYDRALAZINE 20 MG/ML INJECTION SOLUTION
5.0000 mg | INTRAMUSCULAR | Status: DC | PRN
Start: 2024-04-21 — End: 2024-04-21

## 2024-04-21 MED ORDER — IPRATROPIUM 0.5 MG-ALBUTEROL 3 MG (2.5 MG BASE)/3 ML NEBULIZATION SOLN
3.0000 mL | INHALATION_SOLUTION | Freq: Once | RESPIRATORY_TRACT | Status: DC | PRN
Start: 2024-04-21 — End: 2024-04-21

## 2024-04-21 MED ORDER — SODIUM CHLORIDE 0.9 % IRRIGATION SOLUTION
Freq: Once | Status: DC | PRN
Start: 2024-04-21 — End: 2024-04-21

## 2024-04-21 MED ORDER — BUPIVACAINE LIPOSOME(PF) 1.3 %(13.3 MG/ML) SUSPENSION FOR INFILTRATION
Freq: Once | Status: AC | PRN
Start: 2024-04-21 — End: 2024-04-21
  Administered 2024-04-21: 10 mL

## 2024-04-21 MED ORDER — FENTANYL (PF) 50 MCG/ML INJECTION WRAPPER
50.0000 ug | INJECTION | Freq: Once | INTRAMUSCULAR | Status: DC | PRN
Start: 2024-04-21 — End: 2024-04-21

## 2024-04-21 MED ORDER — FENTANYL (PF) 50 MCG/ML INJECTION WRAPPER
100.0000 ug | INJECTION | Freq: Once | INTRAMUSCULAR | Status: DC | PRN
Start: 2024-04-21 — End: 2024-04-21
  Filled 2024-04-21: qty 2

## 2024-04-21 MED ORDER — OXYCODONE 5 MG TABLET
5.0000 mg | ORAL_TABLET | ORAL | 0 refills | Status: AC | PRN
Start: 2024-04-21 — End: 2024-04-24
  Filled 2024-04-21: qty 18, 3d supply, fill #0

## 2024-04-21 MED ORDER — ACETAMINOPHEN 500 MG TABLET
1000.0000 mg | ORAL_TABLET | Freq: Once | ORAL | Status: AC
Start: 2024-04-21 — End: 2024-04-21
  Administered 2024-04-21: 1000 mg via ORAL
  Filled 2024-04-21: qty 2

## 2024-04-21 MED ORDER — PROCHLORPERAZINE EDISYLATE 10 MG/2 ML (5 MG/ML) INJECTION SOLUTION
5.0000 mg | Freq: Once | INTRAMUSCULAR | Status: DC | PRN
Start: 2024-04-21 — End: 2024-04-21

## 2024-04-21 MED ORDER — KETAMINE 10 MG/ML INJECTION WRAPPER
INTRAMUSCULAR | Status: AC
Start: 2024-04-21 — End: 2024-04-21
  Filled 2024-04-21: qty 5

## 2024-04-21 MED ORDER — SODIUM CHLORIDE 0.9% FLUSH BAG - 250 ML
INTRAVENOUS | Status: DC | PRN
Start: 2024-04-21 — End: 2024-04-21

## 2024-04-21 MED ORDER — DEXMEDETOMIDINE 4 MCG/ML IV DILUTION
Freq: Once | INTRAMUSCULAR | Status: DC | PRN
Start: 2024-04-21 — End: 2024-04-21
  Administered 2024-04-21: 12 ug via INTRAVENOUS

## 2024-04-21 MED ORDER — MIDAZOLAM 1 MG/ML INJECTION WRAPPER
2.0000 mg | Freq: Once | INTRAMUSCULAR | Status: DC | PRN
Start: 2024-04-21 — End: 2024-04-21
  Administered 2024-04-21: 2 mg via INTRAVENOUS
  Filled 2024-04-21: qty 2

## 2024-04-21 MED ORDER — CEPHALEXIN 500 MG CAPSULE
500.0000 mg | ORAL_CAPSULE | Freq: Four times a day (QID) | ORAL | 0 refills | Status: AC
Start: 2024-04-21 — End: 2024-04-24
  Filled 2024-04-21: qty 12, 3d supply, fill #0

## 2024-04-21 MED ORDER — LIDOCAINE (PF) 100 MG/5 ML (2 %) INTRAVENOUS SYRINGE
INJECTION | Freq: Once | INTRAVENOUS | Status: DC | PRN
Start: 2024-04-21 — End: 2024-04-21
  Administered 2024-04-21: 100 mg via INTRAVENOUS

## 2024-04-21 MED ORDER — MIDAZOLAM 1 MG/ML INJECTION WRAPPER
1.0000 mg | Freq: Once | INTRAMUSCULAR | Status: DC | PRN
Start: 2024-04-21 — End: 2024-04-21

## 2024-04-21 SURGICAL SUPPLY — 39 items
2.8 driver (ORTHOPEDICS (NOT IMPLANTS)) IMPLANT
5.0 drill (ORTHOPEDICS (NOT IMPLANTS)) IMPLANT
7.0x65 FT Beam IMPLANT
8.0 step drill (ORTHOPEDICS (NOT IMPLANTS)) IMPLANT
8x135 midfoot nail IMPLANT
APPL 70% ISPRP 2% CHG 26ML CHLRPRP HI-LT ORNG PREP STRL LF  DISP CLR (MED SURG SUPPLIES) ×2 IMPLANT
BAG 36X36IN BAND EQP (DRAPE/PACKS/SHEETS/OR TOWEL) ×2 IMPLANT
BANDAGE ESMARK 9FTX4IN STRL SYN COMPRESS LF (WOUND CARE SUPPLY) ×2 IMPLANT
BLADE 15 BD RB-BCK CBNSTL SURG TISS STRL LF  DISP (SURGICAL CUTTING SUPPLIES) ×8 IMPLANT
BLADE ARTHRO ENDOBLADE PLNTR FSCA RELEASE SYS (ENDOSCOPIC SUPPLIES) IMPLANT
BURR SURG 13MM 2.9MM STR (SURGICAL CUTTING SUPPLIES) IMPLANT
BURR SURG 19.5MM 2MM CON (SURGICAL CUTTING SUPPLIES) IMPLANT
COVER RIGID STRL LF  CNVRT LIGHT HNDL PLASTIC DISP GRN (MED SURG SUPPLIES) ×2 IMPLANT
CUFF TOURNIQUET RD 18X4IN COLOR CUF CYL 2 PORT BLADDER QC LOW PROF STRL LF  DISP (MED SURG SUPPLIES) ×2 IMPLANT
CUP MED 2OZ PLASTIC GRAD STRL LF  DISP (MED SURG SUPPLIES) ×4 IMPLANT
DRAPE 2 INCS FILM ANTIMIC 23X17IN IOBN STRL SURG (DRAPE/PACKS/SHEETS/OR TOWEL) ×2 IMPLANT
DRAPE 76X55IN 3 QT HALYARD LF  STRL DISP SURG (DRAPE/PACKS/SHEETS/OR TOWEL) ×2 IMPLANT
DRAPE CARM FLRSCP EXPD CLPSBL C-ARMOR STRL EQP (DRAPE/PACKS/SHEETS/OR TOWEL) ×2 IMPLANT
DRAPE SURGICAL LOWER EXTREMITY LF STRL DISP (DRAPE/PACKS/SHEETS/OR TOWEL) ×2 IMPLANT
DRESS PETRO 9X5IN CURAD XR GAUZE NONADH OCL OVRWRAP FN MESH LF  STRL WHT (WOUND CARE SUPPLY) ×2 IMPLANT
ELECTRODE PATIENT RTN 9FT VLAB C30- LB RM PHSV ACRL FOAM CORD NONIRRITATE NONSENSITIZE ADH STRP (SURGICAL CUTTING SUPPLIES) ×2 IMPLANT
GOWN SURG XL STD LGTH L4 HKLP CLSR RGLN SLEEVE TWL STRL LF  DISP WHT AERO CR PRFRM FBRC (DRAPE/PACKS/SHEETS/OR TOWEL) ×2 IMPLANT
GRAFT BONE ALLOSYNC DEMINERALIZE BONE MATRIX 5CC GEL (IMPLANTS TRAUMA) IMPLANT
KIT RM TURNOVER STPC DISP (DRAPE/PACKS/SHEETS/OR TOWEL) ×2 IMPLANT
KIT SYRG INFUS SET LL CAP TOURNIQUET S2 AW ACP 19GA DISP LF  ALC (MED SURG SUPPLIES) IMPLANT
NEEDLE HYPO  21GA 1.5IN TW ECLIPSE POLYPROP REG BVL LL PVT SHIELD MECH DEHP-FR GRN STRL LF  DISP (MED SURG SUPPLIES) ×2 IMPLANT
PAD ABDOMINAL 9X5IN LF  STRL DISP (WOUND CARE SUPPLY) ×4 IMPLANT
PADDING CAST 4YDX4IN WEBRIL COTTON STRL LF (ORTHOPEDICS (NOT IMPLANTS)) ×12 IMPLANT
PEN SURG MRKNG DVN SKIN DISP NONSMEAR REG TIP FLXB RLR LBL GNTN VIOL STRL LF (MED SURG SUPPLIES) ×2 IMPLANT
PENCIL SMOKEVAC COAT PSHBTN LF (MED SURG SUPPLIES) ×2 IMPLANT
PIN FIX 2.4MM STEINMANN (IMPLANTS TRAUMA) IMPLANT
SCREW BONE ORTHOLOC 3.5MM 20MM SLF TAP LOW PROF HEAD SS MIDFOOT CORT F/T BRNZ FOOT RECON SYS (IMPLANTS TRAUMA) IMPLANT
SPONGE GAUZE 4X4IN MDCHC COTTON 16 PLY USP TY VII LF  STRL DISP (WOUND CARE SUPPLY) ×4 IMPLANT
SPONGE LAP 18X18IN STD 4 PLY XRY RF DTBL ABS RFDETECT COTTON STRL LF  DISP (MED SURG SUPPLIES) ×2 IMPLANT
STKNT ORTHO 60X6IN COTTON 1 PLY STRL (ORTHOPEDICS (NOT IMPLANTS)) ×2 IMPLANT
SYRINGE LL 20ML STRL GRAD MED DISP (MED SURG SUPPLIES) ×2 IMPLANT
TIP SUCT ARGYLE CURITY FRZR 12FR BRSS PLASTIC NI REM OBTURATOR NCDTV HNDL PLBL STRL LF  DISP (ENDOSCOPIC SUPPLIES) ×2 IMPLANT
TRAY CUSTOM ORTHO MINOR  - ~~LOC~~ (CUSTOM TRAYS & PACK) ×2 IMPLANT
conical burr (ORTHOPEDICS (NOT IMPLANTS)) IMPLANT

## 2024-04-21 NOTE — Anesthesia Postprocedure Evaluation (Signed)
 Anesthesia Post Op Evaluation    Patient: Justin [name].  Procedure(s) with comments:  MIDFOOT FUSION - ANESTHESIA BLOCK  C-ARM, MIS BURR, ARTHREX ACP, BATTERY DRILL/SAW, STRYKER MIDFOOT NAIL BEAM    Last Vitals:Temperature: 36.3 C (97.3 F) (04/21/24 1112)  Heart Rate: 94 (04/21/24 1112)  BP (Non-Invasive): (!) 87/60 (IVF bolus15) (04/21/24 1112)  Respiratory Rate: 16 (04/21/24 1112)  SpO2: 97 % (04/21/24 1112)    No notable events documented.    Patient is sufficiently recovered from the effects of anesthesia to participate in the evaluation and has returned to their pre-procedure level.  Patient location during evaluation: PACU       Patient participation: complete - patient participated  Level of consciousness: awake and alert and responsive to verbal stimuli    Pain management: adequate  Airway patency: patent    Anesthetic complications: no  Cardiovascular status: acceptable  Respiratory status: acceptable  Hydration status: acceptable  Patient post-procedure temperature: Pt Normothermic   PONV Status: Absent

## 2024-04-21 NOTE — OR Surgeon (Addendum)
 PATIENT NAME: [name], [name]  HOSPITAL NUMBER:  [id number]  DATE OF SERVICE: 04/21/2024  DATE OF BIRTH:  03-29-1976    OPERATIVE REPORT    PREOPERATIVE DIAGNOSES:  1. Left midfoot neuropathic arthritis.  2. Left naviculocuneiform dislocation.  3. Symptomatic exostosis of the left cuboid.  4. Left equinus contracture.    POSTOPERATIVE DIAGNOSES:  1. Left midfoot neuropathic arthritis.  2. Left naviculocuneiform dislocation.  3. Symptomatic exostosis of the left cuboid.  4. Left equinus contracture.    NAME OF PROCEDURES:  1. Open reduction and internal fixation of left naviculocuneiform dislocation.  2. Left midfoot fusion multiple transverse.  3. Percutaneous tendo-Achilles lengthening.    SURGEON:  Lupita Skene, MD.    ASSISTANT:  Rosina Grippe, PA-C.    ANESTHESIA:  Popliteal block with sedation.    ESTIMATED BLOOD LOSS:  Minimal.    COMPLICATIONS:  None.    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  He also developed significant neuropathic arthritis of the left midfoot to include tarsometatarsal joints 1, 2, and 3 which is again a result of diabetes mellitus and neuropathy.  He failed the conservative treatments.  Operative and nonoperative interventions were explained in detail.  Informed consent was obtained.    DESCRIPTION OF PROCEDURE:  The patient was brought to the operating room and placed supine on the operating table.  After induction of appropriate anesthesia, the left foot was prepped and draped in standard surgical fashion.  At this point, the right foot as Esmarched and a calf tourniquet inflated to 250 mmHg.  Medial and dorsal incisions were made over the midfoot area.  Dissection was carried down.  The naviculocuneiform and tarsometatarsal joints 1, 2, and 3 were exposed.  At this point, the naviculocuneiform joint was reduced using osteotomes.  An  open reduction of the left naviculocuneiform joint was completed.  The left naviculocuneiform joint was debrided of its articular cartilage and reduced to appropriate alignment.  Once this was completed, the cartilage was removed from the first , second and third tarsometatarsal joint.  Demineralized bone matrix was placed in these joints.  At this point, the left naviculocuneiform, talonavicular, and tarsometatarsal joints 1, 2, and 3 were reduced, fixed, and fused using a Stryker midfoot nail along the medial column.  A Stryker Salvation beam was placed across the second and third tarsometatarsal joints to provisionally fuse those joints in alignment.  At this point, fluoroscopy verified appropriate placement of hardware and appropriate alignment of the foot.  Three small incisions were made over the distal portion of the Achilles.  A tendo-Achilles lengthening was performed of the left Achilles with good resolution of the equinus contracture.  At this point, a lateral incision was made over the foot.  Dissection was carried down using an Arthrex minimally invasive bur.  A partial excision and craterization of the cuboid was completed to remove the exostosis there.  At this point, all wounds were copiously irrigated and closed with Vicryl and nylon sutures.  Sterile dressings were applied.  A postop splint was applied.  The patient was sent to the recovery room in good condition.    I, Dr. Lupita Skene, was physically present throughout the entire case and assisted by Rosina Grippe, PA-C, throughout the entire procedure.      ASSISTANT PARTICIPATION AND MEDICAL NECESSITY: The nature of this procedure required an assistant for the successful completion  of this surgery. The presence of the PA-C/Assistant is utilized for crucial parts of this procedure such as incision, retraction, closure and an additional pair of skilled hands used throughout the entire surgical process. I was present for the entire  case.        The patient will be nonweightbearing on the left foot for a period of 6-8 weeks.  I will see him back in the office in 3 weeks and take down the dressings.  We will check the wounds.  At that point if everything looks good we will do active range of motion of the ankle; however, he will not bear weight.  Only at about 6-8 weeks I will see him back.  I will get x-rays through the left foot.  If everything looks good, I will have him start progressive weightbearing at that point.  We will get x-rays 3 views of the foot at the second visit as well.        Lupita Skene, MD                DD:  04/21/2024 16:00:39  DT:  04/21/2024 17:19:03 TM  D#:  [id number]

## 2024-04-21 NOTE — Interval H&P Note (Signed)
 [facility]      H&P UPDATE FORM                                                                                  Justin [name], Usery., 48 y.o. male  Date of Admission:  04/21/2024  Date of Birth:  1975/11/18    04/21/2024    STOP: IF H&P IS GREATER THAN 30 DAYS FROM SURGICAL DAY COMPLETE NEW H&P IS REQUIRED.     H & P updated the day of the procedure.  1.  H&P completed within 30 days of surgical procedure and has been reviewed within 24 hours of admission but prior to surgery or a procedure requiring anesthesia services, the patient has been examined, and no change has occured in the patients condition since the H&P was completed.       Change in medications: No              Comments:     2.  Patient continues to be appropriate candidate for planned surgical procedure. YES    Rosina Grippe, PA-C

## 2024-04-21 NOTE — Anesthesia Procedure Notes (Signed)
 [name].    Block: Peripheral Block    Performed By:   Authorizing Provider:  Boston Christians, DO  Performing Provider:  Boston Christians, DO       Sedation        Blocks  Left Adductor femoral  Type of Block: single shot    Ultrasound was used to identify the nerve structure(s) along with needle placement during the procedure  Image:  Images saved on portable media  Diagnosis: knee pain   Indication: Requested by surgeon and Acute post operative pain  Pt location: at Bedside      Pre anesthesia checklist: H&P updated and consent obtained, Patient positioned, Patient monitors applied, Timeout performed:, Site verified, Emergency drugs and equipment available and anesthesia consent given  Technique(See MAR for doses)  Approach:  Peripheral Block   Preprocedure hand washing was performed sterile field maintained      Sterile Skin Prep : Hand hygiene performed, Mask, Sterile gloves, Sterile field established and Sterile technique    Skin prepped with: Chlorhexidine gluconate    Skin Local      Needle    Needle Gauge: 20G   Needle length: 4 in.  Needle localization: ultrasound guidance  Number of attempts: 1      Catheter          Site      Medications  Medications:  BUPivacaine PF (MARCAINE) 0.5% injection - Infiltration   10 mL - 04/21/2024 9:09:00 AM  Assessment  Injection assessment: incremental injection, local visualized surrounding nerve on ultrasound and negative aspiration for heme  Paresthesia pain: none    Heart Rate changed: No    Events     Patient tolerance of procedure: tolerated well, no immediate complications

## 2024-04-21 NOTE — Anesthesia Procedure Notes (Signed)
 Justin [name].    Block: Peripheral Block    Performed By:   Authorizing Provider:  Boston Christians, DO  Performing Provider:  Boston Christians, DO       Sedation        Blocks  Left sciatic Popliteal Fossa  Type of Block: single shot    Ultrasound was used to identify the nerve structure(s) along with needle placement during the procedure  Image:  Images saved on portable media  Diagnosis: ankle pain   Indication: Requested by surgeon and Acute post operative pain  Pt location: at Bedside      Pre anesthesia checklist: H&P updated and consent obtained, Patient positioned, Patient monitors applied, Timeout performed:, Site verified, Emergency drugs and equipment available and anesthesia consent given  Technique(See MAR for doses)  Approach:  Peripheral Block   Preprocedure hand washing was performed sterile field maintained      Sterile Skin Prep : Hand hygiene performed, Mask, Sterile gloves, Sterile field established and Sterile technique    Skin prepped with: Chlorhexidine gluconate    Skin Local      Needle    Needle Gauge: 20G   Needle length: 4 in.  Needle localization: ultrasound guidance  Number of attempts: 1      Catheter          Site      Medications  Medications:  BUPivacaine liposomal (PF) (EXPAREL) 133 mg/10 mL local injection - Local Infiltration   10 mL - 04/21/2024 9:10:00 AM  BUPivacaine PF (MARCAINE) 0.5% injection - Infiltration   10 mL - 04/21/2024 9:10:00 AM  Assessment  Injection assessment: incremental injection, local visualized surrounding nerve on ultrasound and negative aspiration for heme  Paresthesia pain: none    Heart Rate changed: No    Events     Patient tolerance of procedure: tolerated well, no immediate complications

## 2024-04-21 NOTE — Anesthesia Preprocedure Evaluation (Signed)
 ANESTHESIA PRE-OP EVALUATION  Planned Procedure: MIDFOOT FUSION (Left)  Review of Systems     anesthesia history negative     patient summary reviewed          Pulmonary   Unlabored respirations on room air, sleep apnea, CPAP and past history of smoking ,  no shortness of breath and no home oxygen   Cardiovascular    Hypertension, ECG reviewed and hyperlipidemia ,No past MI and no angina,        GI/Hepatic/Renal   negative GI/hepatic/renal ROS,         Endo/Other    obesity,   type 2 diabetes    Neuro/Psych/MS    OCD no seizures, no CVA       Cancer                        Physical Assessment      Airway       Mallampati: II    TM distance: >3 FB    Neck ROM: full  Mouth Opening: good.            Dental           (+) poor dentition           Pulmonary    Comment: Unlabored respirations on room air         Cardiovascular    Rhythm: regular  Rate: Normal       Other findings              Plan  ASA 2     Planned anesthesia type: MAC           PONV Plan:  I plan to administer pharmcologic prophalaxis antiemetics          Additional Plans: Pre-op Block    Intravenous induction       Anesthetic plan and risks discussed with patient  signed consent obtained          Patient's NPO status is appropriate for Anesthesia.           Plan discussed with CRNA.

## 2024-04-21 NOTE — Nurses Notes (Signed)
 Pt states that he has knee scooter and crutches at home.

## 2024-04-21 NOTE — Anesthesia Transfer of Care (Signed)
 ANESTHESIA TRANSFER OF CARE   Justin [name]. is a 48 y.o. ,male, Weight: 110 kg (242 lb 8.1 oz)   had Procedure(s) with comments:  MIDFOOT FUSION - ANESTHESIA BLOCK  C-ARM, MIS BURR, ARTHREX ACP, BATTERY DRILL/SAW, STRYKER MIDFOOT NAIL BEAM  performed  04/21/24   Primary Service: Lupita Skene, MD    Past Medical History:   Diagnosis Date    Amputated great toe     Atherosclerosis     CPAP (continuous positive airway pressure) dependence     Diabetes mellitus, type 2     HLD (hyperlipidemia)     Hypertension     Intestinal hemorrhage     OCD (obsessive compulsive disorder)     Osgood-Schlatter's disease     Perianal abscess     Sleep apnea     Torsion of epididymis       Allergy History as of 04/21/24        No Known Allergies                  I completed my transfer of care / handoff to the receiving personnel during which we discussed:        Post Location: PACU                                                             Last OR Temp: Temperature: 36.3 C (97.3 F)      Airway:* No LDAs found *  Blood pressure (!) 87/60, pulse 94, temperature 36.3 C (97.3 F), resp. rate 15, height 1.778 m (5' 10), weight 110 kg (242 lb 8.1 oz), SpO2 97%.

## 2024-04-21 NOTE — Brief Op Note (Signed)
 [place]  [facility]                                                     BRIEF OPERATIVE NOTE    Patient Name: Justin, [name].  Hospital Number: [id number]  Date of Service: 04/21/2024   Date of Birth: 1975-10-14    All elements must be documented.    Pre-Operative Diagnosis:  Left Charcot foot  Post-Operative Diagnosis:  Same  Procedure(s)/Description:  Left midfoot fusion    Attending Surgeon: Dr Lupita T. Hasselman, MD   Assistant(s): Rosina Grippe PA-C     Anesthesia Type: Monitored Anesthesia Care (MAC) / Block  Estimated Blood Less:  Minimal  Complications (not routinely expected or not inherent to difficulty/nature of procedure):  None    Specimens/ Cultures:  None           Disposition: PACU - hemodynamically stable.  Condition: stable    Rosina Grippe, PA-C

## 2024-04-23 ENCOUNTER — Encounter (HOSPITAL_BASED_OUTPATIENT_CLINIC_OR_DEPARTMENT_OTHER): Payer: Self-pay | Admitting: Specialist

## 2024-04-26 ENCOUNTER — Encounter (HOSPITAL_BASED_OUTPATIENT_CLINIC_OR_DEPARTMENT_OTHER): Payer: Self-pay | Admitting: Specialist

## 2024-05-12 ENCOUNTER — Other Ambulatory Visit (HOSPITAL_BASED_OUTPATIENT_CLINIC_OR_DEPARTMENT_OTHER): Payer: Self-pay | Admitting: Specialist

## 2024-05-12 DIAGNOSIS — Z09 Encounter for follow-up examination after completed treatment for conditions other than malignant neoplasm: Secondary | ICD-10-CM

## 2024-05-13 ENCOUNTER — Other Ambulatory Visit: Payer: Self-pay

## 2024-05-13 ENCOUNTER — Encounter (HOSPITAL_BASED_OUTPATIENT_CLINIC_OR_DEPARTMENT_OTHER): Payer: Self-pay | Admitting: Specialist

## 2024-05-14 ENCOUNTER — Encounter (HOSPITAL_BASED_OUTPATIENT_CLINIC_OR_DEPARTMENT_OTHER): Payer: Self-pay | Admitting: Physician Assistant

## 2024-05-14 ENCOUNTER — Ambulatory Visit: Payer: Self-pay | Attending: Physician Assistant | Admitting: Physician Assistant

## 2024-05-14 ENCOUNTER — Ambulatory Visit (HOSPITAL_BASED_OUTPATIENT_CLINIC_OR_DEPARTMENT_OTHER)
Admission: RE | Admit: 2024-05-14 | Discharge: 2024-05-14 | Disposition: A | Discharge status: Home / Self Care | Source: Ambulatory Visit | Admitting: Radiology

## 2024-05-14 ENCOUNTER — Other Ambulatory Visit: Payer: Self-pay

## 2024-05-14 ENCOUNTER — Ambulatory Visit (HOSPITAL_COMMUNITY)
Admission: RE | Admit: 2024-05-14 | Discharge: 2024-05-14 | Disposition: A | Discharge status: Home / Self Care | Source: Ambulatory Visit | Attending: Specialist | Admitting: Specialist

## 2024-05-14 ENCOUNTER — Other Ambulatory Visit (HOSPITAL_BASED_OUTPATIENT_CLINIC_OR_DEPARTMENT_OTHER): Payer: Self-pay | Admitting: Specialist

## 2024-05-14 VITALS — Ht 70.0 in

## 2024-05-14 DIAGNOSIS — M79662 Pain in left lower leg: Secondary | ICD-10-CM

## 2024-05-14 DIAGNOSIS — Z09 Encounter for follow-up examination after completed treatment for conditions other than malignant neoplasm: Secondary | ICD-10-CM

## 2024-05-14 DIAGNOSIS — Z981 Arthrodesis status: Secondary | ICD-10-CM

## 2024-05-14 DIAGNOSIS — M14672 Charcot's joint, left ankle and foot: Secondary | ICD-10-CM

## 2024-05-15 ENCOUNTER — Encounter (HOSPITAL_BASED_OUTPATIENT_CLINIC_OR_DEPARTMENT_OTHER): Payer: Self-pay | Admitting: Specialist

## 2024-05-19 NOTE — Progress Notes (Signed)
 [facility]  [address]  [place] [address]  Operated by [facility]    Name: Justin [name]. MRN:  [id number]   Date: 05/14/2024 DOB:  June 24, 1975 (48 y.o.)     Justin [name]. presents to the office s/p open reduction internal fixation of the left navicular cuneiform dislocation, left midfoot fusion, percutaneous tendo-Achilles lengthening done by Dr. Edison on April 21, 2024. He reports pain is mild. He has been nonweightbearing in his splint.  He has been taking aspirin  for deep vein thrombosis prophylaxis denies any shortness of breath but states he has some calf pain.  He also states he has right thigh numbness.  He had this prior to surgery he had back injections recently.  But states he did tell his PCP who was not overly concerned about this but has not mentioned it to his pain management doctor yet.    Physical Exam: Izak Anding. is a pleasant 48 y.o. male in no acute distress. Dressing was removed, sutures were removed. Incision is appears to be healing well with good reapproximation and shows no evidence of infection, separation, or keloid formation. Swelling is mild.The foot and ankle are in good alignment. Patient is able to dorsiflex +5. Calf is soft and let tender. Distal sensation and pulses are intact.     Imaging: X-rays of the left foot were obtained which show hardware in good position    Assessment and Plan: It was discussed with Justin [name]. that he is doing well. He will now go into a boot which we will help stabilize the foot while the fusions are healing.  We will continue nonweightbearing for another 5 more weeks.  He will continue the aspirin  for deep vein thrombosis prophylaxis.  He had nurse stands as he is having calf pain we are going to send him over for a stat Doppler by the time this was dictated his Doppler came back negative for a deep vein thrombosis.  Regarding his right thigh numbness I told  him to reach out to his pain management doctor to discuss this with them as this is not coming from her surgery.SABRA  He should continue to elevate and ice.  He can shower with soap and water , no soaking until incisions are fully healed. No lotions or creams  on the incision until fully healed.  Can do active plantar and dorsiflexion exercises of the ankle. Pain medication not refilled.   He will return in 5 weeks to see Dr Edison at that time we will obtain x-rays of his left foot nonweightbearing views in his right foot weight-bearing views. All of their questions were answered to their satisfaction    This visit was rendered independently without a supervising/collaborating physician on site.

## 2024-05-22 ENCOUNTER — Other Ambulatory Visit (HOSPITAL_BASED_OUTPATIENT_CLINIC_OR_DEPARTMENT_OTHER): Payer: Self-pay | Admitting: Physician Assistant

## 2024-06-12 ENCOUNTER — Other Ambulatory Visit (HOSPITAL_BASED_OUTPATIENT_CLINIC_OR_DEPARTMENT_OTHER): Payer: Self-pay | Admitting: Specialist

## 2024-06-12 DIAGNOSIS — Z09 Encounter for follow-up examination after completed treatment for conditions other than malignant neoplasm: Secondary | ICD-10-CM

## 2024-06-21 ENCOUNTER — Other Ambulatory Visit: Payer: Self-pay

## 2024-06-22 ENCOUNTER — Ambulatory Visit (HOSPITAL_BASED_OUTPATIENT_CLINIC_OR_DEPARTMENT_OTHER): Admission: RE | Admit: 2024-06-22 | Discharge: 2024-06-22 | Disposition: A | Source: Ambulatory Visit

## 2024-06-22 ENCOUNTER — Ambulatory Visit: Attending: Specialist | Admitting: Specialist

## 2024-06-22 ENCOUNTER — Other Ambulatory Visit: Payer: Self-pay

## 2024-06-22 ENCOUNTER — Encounter (HOSPITAL_BASED_OUTPATIENT_CLINIC_OR_DEPARTMENT_OTHER): Payer: Self-pay | Admitting: Specialist

## 2024-06-22 VITALS — Ht 70.0 in

## 2024-06-22 DIAGNOSIS — Z09 Encounter for follow-up examination after completed treatment for conditions other than malignant neoplasm: Secondary | ICD-10-CM

## 2024-06-22 DIAGNOSIS — Z4789 Encounter for other orthopedic aftercare: Secondary | ICD-10-CM

## 2024-06-22 DIAGNOSIS — M76821 Posterior tibial tendinitis, right leg: Secondary | ICD-10-CM | POA: Insufficient documentation

## 2024-06-22 DIAGNOSIS — Z9889 Other specified postprocedural states: Secondary | ICD-10-CM

## 2024-06-22 NOTE — Addendum Note (Signed)
 Addended by: EDISON PITTS on: 06/22/2024 12:22 PM     Modules accepted: Orders

## 2024-06-22 NOTE — Progress Notes (Unsigned)
 PATIENT NAME: Justin [name], Justin [name]  HOSPITAL NUMBER:  [id number]  DATE OF SERVICE: 06/22/2024  DATE OF BIRTH:  03/20/1976    PROGRESS NOTE    SUBJECTIVE:  Justin [name] came in for a followup.  He is 2 months status post a left midfoot fusion with an intramedullary midfoot nail for Charcot deformity.  He is still getting some pain in the right foot.  Today, he is really having no pain in the left foot.  He is otherwise doing well.    OBJECTIVE:  Today when I examined him, his left foot has excellent alignment.  The incisions are all well healed.    IMAGING:  X-rays through the left foot show progressive fusion of the midfoot and hindfoot joints with an intact nail.    X-rays, 3 views of the right ankle were obtained today, personally reviewed by me, and show increased inclination of the talonavicular joint on the AP foot.    ASSESSMENT AND PLAN:  I am going to let him start weightbearing as tolerated in the boot and slowly transition over to a diabetic shoe.  I did tell him that he most likely will take about 6 more weeks off work.  He needs to do physical therapy and slowly wean himself from the boot and back into a shoe.  I am going to get him a BOA brace for his right ankle.  We will eventually get him into diabetic shoes.  I told him I will see him back in 6 weeks and at that point make a decision about him returning to work.        Lupita Skene, MD                DD:  06/22/2024 12:54:58  DT:  06/22/2024 19:57:04 TM  D#:  [id number]

## 2024-06-24 ENCOUNTER — Other Ambulatory Visit (HOSPITAL_COMMUNITY): Payer: Self-pay | Admitting: Specialist

## 2024-06-24 DIAGNOSIS — Z09 Encounter for follow-up examination after completed treatment for conditions other than malignant neoplasm: Secondary | ICD-10-CM

## 2024-06-25 ENCOUNTER — Ambulatory Visit
Admission: RE | Admit: 2024-06-25 | Discharge: 2024-06-25 | Disposition: A | Discharge status: Still In Hospital | Payer: Self-pay | Source: Ambulatory Visit | Attending: Specialist | Admitting: Specialist

## 2024-06-25 ENCOUNTER — Other Ambulatory Visit: Payer: Self-pay

## 2024-06-25 DIAGNOSIS — Z09 Encounter for follow-up examination after completed treatment for conditions other than malignant neoplasm: Secondary | ICD-10-CM | POA: Insufficient documentation

## 2024-06-25 DIAGNOSIS — Z981 Arthrodesis status: Secondary | ICD-10-CM | POA: Insufficient documentation

## 2024-06-25 NOTE — PT Evaluation (Signed)
 [facility]  Outpatient Physical Therapy  [address]  [place], [address]  [phone]  (Fax) [phone]      Physical Therapy Lower Extremity Evaluation    Date: 06/25/2024  Patient's Name: Justin [name].  Date of Birth: 1976-03-31  Physical Therapy Evaluation     Evaluating Physical Therapist: SHAWNEE MOORE PT   PT diagnosis/Reason for Referral:  rehab s/p  status post a left midfoot fusion with an intramedullary midfoot nail for Charcot deformity.   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  He weaned himself from NWB to FWB with cane immediately  once instructed by MD to start WBAT.  He continues to use his walking boot.     PLOF: painful WB  pre op ;  neuropathy for years     Previous episodes/treatments: surgery (  fusion of mid foot to 3rd and 2nd tarsal-metatarsals , NWB   until 06/22/24. Now WBAT     Medications for this problem: prescription Ibuprofen,     Diagnostic tests: xray pre and post op . Per pt , his foot is healing but not all the way healed     Past Medical History:   Past Medical History:   Diagnosis Date    Amputated great toe     Atherosclerosis     CPAP (continuous positive airway pressure) dependence     Diabetes mellitus, type 2     HLD (hyperlipidemia)     Hypertension     Intestinal hemorrhage     OCD (obsessive compulsive disorder)     Osgood-Schlatter's disease     Perianal abscess     Sleep apnea     Torsion of epididymis      Diagnosed with DM in 2004, takes insulin      Past Surgical History:    Past Surgical History:   Procedure Laterality Date    COLONOSCOPY      CYSTECTOMY      HX APPENDECTOMY      LAPOROTOMY    SURGERY SCROTAL / TESTICULAR Right     TOE AMPUTATION Left     great toe       Patient goals: walk with less pain , RTW0    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    Next MD visit: 08/05/24    Pain location: lateral ankle pain, lateral foot pain , less pain in plantar foot  ; heel pain with WB                    Pain description: SHARP, DULL, ACHING, and STABBING    Pain frequency:  CONSTANT,  BUT VARIABLE     Pain rating: Now 4   Best 1-2  ( neuropathy pain)   Worst 7     Radiculopathy: no    Pain increases with: weight bearing            decreases with :  NWB, positioning, ibuprofen     Sensation: tingling, pins and needles from neuropathy; pt has decreased sensation to LT     Weakness: YES post op     Sleep affected: no     Subjective Functional Reports:    Sitting: wfl repositions frequently    Standing: 5 min     Walking: LIMITED and around 100 ft with cane      Stairs : step to with rail and cane   Lifting: unable     Personal ADLs: indep  with pain donning doffing  sock boot               OBJECTIVE    A/PROM   right left   Ankle DF 10 degrees  8/10   Ankle PF 45 degrees 8/20   Ankle Inversion 0 0/0   Ankle Eversion 20 5     ROM comments : major loss of motion at talofibular joint     Strength (Manual Muscle Testing per [employer] Muscle Grading system); 2/5 MMT right ankle and 4/5 MMT  quads and hamstrings      Activity   Effort - min , mod , max ,unable      Altered body mechanics yes/no    Associated with pain yes/no     Sit to stand to sit Max effort without UE counterbalance , min LLE WB  yes [x]  Yes  []  No   Bed mobility  Min  no []  Yes  [x]  No    Standing heel raise          Bilat          Unilat L/R  Unable , NT  []  Yes  []  No   Standing ankle DF with toe tap  Unable , NT  []  Yes  []  No   Heel walk   Toe walk  Unable   Unable   []  Yes  []  No   Hip/knee  flexion excursion with squat  Unable   []  Yes  []  No   SLS knee flexion excursion        RLE        LLE Unable  []  Yes  []  No   Supine bridging  Min   []  Yes  [x]  No   SLS  balance   right :                          Left : Unable   Unable   []  Yes  []  No     Strength comments; non function in right ankle; reduced generally in RLE after a prolonged period of NWB on RLE.     Gait: USES ASSISTIVE DEVICE, RECIPROCATING STEPS WITH ASYMMETRIC STRIDE LENGTH, LEFT FOOT DOES NOT PASS RIGHT FOOT, WIDE BOS, and pt keeps left hip held in ER at stance phase of gait;     Assistive devices: custom made cane and walking boot. The pt owns a knee scooter ,  crutches and shower bench and  below knee compression garment     Palpation: decrease sensation to LT on plantar foot, tender 5th MT head and shaft     Appearance: profound atrophy of right gastrocnemius, prior hallux amputation , hemosiderin staining  right foot distal to mid calf  Joint mobility:fused mid foot , hypomobile hind foot     Posture:pes planus in  right foot, Charcot foot in operated foot; prior hallux amputation left foot  Treatment provided:evaluation               ASSESSMENT    Impression: the pt has severely limited  talotibial ROM , edematous mid foot, loss of sensation  in his operated foot. He has  a long h/o DM  and diabetic neuropathy. He  has significantly altered gait  and transfer mechanics due to non functional ankle ROM and strength, prior h/o hallux amputation and recent Charcot foot stabilization. He is motivated to increase his WB tolerance in order to RTW.      Rehab potential: FAIR      Short-Term Goals: 3 Weeks   -  Patient will demonstrate improved right ankle  AROM to at least 5 degrees or more DF/PF  to improve gait mechanics     -  Patient will demonstrate independence with progressive HEP to maximize gains from physical therapy.     -  Patient will report max 5/10 pain to aid in completion of ADLs/work duties.     -  Patient will  demonstrate improved strength of right  LE WFL for sit to stand with more even wt distribution   -  Patient will wean from walking  with minimal to no gait deviation to aid community ambulation.     Long-Term Goals: 6 Weeks     -  Patient will demonstrate improved right  knee strength of at least 4+/5 to aid in functional transfers.     -  Patient will demonstrate improved TUG time of maximum 2  seconds with least restrictive assistive device to demonstrate decreased falls risk.     -   pt will have improved score with timed 5X sit to stand test     -  Patient will demonstrate improved functional ability with Patient Specific Scale Score of at least 7.     -  Patient will demonstrate ability to ambulate up and down stairs with reciprocl  pattern and single rail  use  to aid in community mobility    PLAN  Patient will attend 2 times per week x 6 weeks. Therapy may include, but is not limited to THERAPEUTIC EXERCISES, MYOFASCIAL/JOINT MOBILIZATION, TRANSFER/GAIT TRAINING, HOME INSTRUCTIONS, and THERAPEUTIC ACTIVITY    Plan for next visit : NWB calf stretch, talo crural mobilization, wean from boot as tolerated . Please get baseline TUG and 5x sit to stand scores and PSFS values.        Evaluation complexity:   Personal factors impacting POC: FREQUENT OR CHRONIC PAIN and OCCUPATIONAL ADLS (IE HEAVY LIFTING, REPETITIVE TASKS, LONG HOURS)   Co-morbidities impacting POC: PSYCHIATRIC DIAGNOSIS, DIABETES WITH COMPLICATIONS, PRE-EXISTING NEUROLOGICAL DEFICITS, and PREVIOUS SURGERIES  Complexity of physical exam: INCLUDING MUSCULOSKELETAL SYSTEM (POSTURE, ROM, STRENGTH, HEIGHT/WEIGHT), INCLUDING NEUROMUSCULAR EXAM (BALANCE, GAIT, LOCOMOTION, MOBILITY), and INCLUDING INTEGUMENTARY EXAM (TISSUE PLIABILITY, SCAR TISSUE, SKIN COLOR)   Clinical Presentation: EVOLVING WITH CHANGING CLINICAL CHARACTERISTICS   Evaluation Complexity: MODERATE-HISTORY 1-2, EXAMINATION 3+, PRESENTATION  EVOLVING/CHANGING      Total Session Time 39 and  Timed code minutes 0         Intervention minutes: EVALUATION 39 minutes    Shawnee Moore, PT  06/25/2024, [phone]

## 2024-06-29 ENCOUNTER — Ambulatory Visit (HOSPITAL_COMMUNITY): Payer: Self-pay

## 2024-07-01 ENCOUNTER — Inpatient Hospital Stay
Admission: RE | Admit: 2024-07-01 | Discharge: 2024-07-01 | Disposition: A | Discharge status: Still In Hospital | Payer: Self-pay | Attending: Specialist | Admitting: Specialist

## 2024-07-01 ENCOUNTER — Other Ambulatory Visit: Payer: Self-pay

## 2024-07-01 NOTE — PT Treatment (Incomplete)
 [facility]  Outpatient Physical Therapy  [address]  [place], [address]  [phone]  (Fax) [phone]    Physical Therapy Treatment Note    Date: 07/01/2024  Patient's Name: Izan Miron.  Date of Birth: Jul 12, 1975  Physical Therapy Visit      Visit #/POC: ***  Authorization: ***  POC Signed?: ***  POC Ends: ***  Order Ends: ***  Next Progress Note Due: ***    Evaluating Physical Therapist: ***  PT diagnosis/Reason for Referral: ***  Next Scheduled Physician Appointment: ***  Allergies/Contraindications: ***      Subjective: Pt reports that he got a brace for his R foot that was recommended by his Dr.     Ardyce: ***    Measured ROM on *** (mm/dd/yyyy): ***    5x STS: 23.52 sec.  TUG:  18.7 sec with cane    Walk to shed on uneven terrain    3  Stand in shower for more than 10 ninutes   3  Stairs     5      EXERCISE/ACTIVITY NAME REPETITIONS RESISTANCE COMPLETED THIS DOS   Long sit calf stretch *** *** ***   *** *** *** ***   *** *** *** ***   *** *** *** ***   *** *** *** ***   *** *** *** ***   *** *** *** ***   *** *** *** ***   *** *** *** ***     DISCONTINUED ACTIVITIES        *** *** *** ***   *** *** *** ***   *** *** *** ***     HEP Last Updated On: ***    Assessment: ***    Plan: ***    {PRN Timed/Untimed billable minutes:43252}  {INTERVENTION MINUTES:42227}      Jakeria Caissie, PTA  07/01/2024, 14:58

## 2024-07-02 ENCOUNTER — Ambulatory Visit
Admission: RE | Admit: 2024-07-02 | Discharge: 2024-07-02 | Disposition: A | Discharge status: Still In Hospital | Payer: Self-pay | Source: Ambulatory Visit | Attending: Specialist | Admitting: Specialist

## 2024-07-02 NOTE — PT Treatment (Signed)
 [facility]  Outpatient Physical Therapy  [address]  [place], [address]  [phone]  (Fax) [phone]    Physical Therapy Treatment Note    Date: 07/02/2024  Patient's Name: Justin [name].  Date of Birth: September 10, 1975  Physical Therapy Visit      Visit #/POC: 3 / 12  Authorization: 2/60 per CY   POC Signed?: NA  POC Ends: 08/06/24  Order Ends: OPEN  Next Progress Note Due: 08/05/24    Evaluating Physical Therapist: SHAWNEE MOORE PT   PT diagnosis/Reason for Referral: LE REHAB S/P SURGICAL STABILIZATION OF CHARCOT FOOT LEFT ( MID FOOT FUSION, ORIF OF NAVICULOCUNEIFORM DISLOCATION)  Next Scheduled Physician Appointment: 08/05/24  Allergies/Contraindications: DM, DIABETIC NEUROPATHY      Subjective:  he started transiting out of the boot Tuesday and did not use it at all yesterday. His  SPS yesterday after going without a boot  all day  = 7.     Objective:  the pt arrives ambulatory without his walking boot and using a walking stick that lacks a proper handle and is not suitable for the pt's height. He was instructed to use an adjustable SPC and was instructed on proper adjustment of a SPC.   EXERCISE/ACTIVITY NAME REPETITIONS RESISTANCE COMPLETED THIS DOS   Physioball mat exercises             Bridging with knees extended            Bridging with knees flexed            Supine leg curl    X10  X10  x10 na Yes, added to HEP 07/02/24   Sit to stand from 20 inch height  5x   3 sets  na Yes, reviewed as part of HEP 1/22   Standing TKE  40 - 0 DEGREES   X10 X2 BLACK TB  Yes, added to HEP 07/02/24   Standing TKE  resisting perturbation force at knee  Intermittent pull  Black TB YES,  added to HEP 1/22    Calf stretch strap    No, added to HEP 1/21                             DISCONTINUED ACTIVITIES                        Access Code: [email or web address]  URL: [email or web address]  Date: 07/02/2024  Prepared by: Shawnee Moore    Exercises  - STANDING KNEE EXTENSION ISOMETRICS   - 1 x  daily - 4 x weekly - 3 sets - 8-10 reps - 5s hold  - Supine Hip and Knee Flexion AROM with Swiss Ball  - 1 x daily - 4 x weekly - 3 sets - 10 reps  - Bridge with Arms at [employer] and Feet on [employer]  - 1 x daily - 4 x weekly - 3 sets - 10 reps  HEP Last Updated On: 07/02/24      ASSESSMENT: the pt is trying to wean from his walking boot very quickly. He is anxious to be ready for RTW. He tolerated WB and NWB exercises well today.   Short-Term Goals: 3 Weeks   -  Patient will demonstrate improved right ankle  AROM to at least 5 degrees or more DF/PF  to improve gait mechanics                -  Patient will demonstrate independence with progressive HEP to maximize gains from physical therapy.                -  Patient will report max 5/10 pain to aid in completion of ADLs/work duties.                -  Patient will demonstrate improved strength of right  LE WFL for sit to stand with more even wt distribution   -  Patient will wean from walking  with minimal to no gait deviation to aid community ambulation.     Long-Term Goals: 6 Weeks                -  Patient will demonstrate improved right  knee strength of at least 4+/5 to aid in functional transfers.                -  Patient will demonstrate improved TUG time of maximum 2  seconds with least restrictive assistive device to demonstrate decreased falls risk.                -   pt will have improved score with timed 5X sit to stand test                -  Patient will demonstrate improved functional ability with Patient Specific Scale Score of at least 7.                -  Patient will demonstrate ability to ambulate up and down stairs with reciprocl  pattern and single rail  use  to aid in community mobility      Plan: continue 2x weekly , encourage gradual weaning for boot. Include close chain strengthening on LE press .     Total Session Time 35 and Timed code minutes 35  THERAPEUTIC EXERCISE 35 minutes      Shawnee Moore, PT  07/02/2024, 15:01

## 2024-07-06 ENCOUNTER — Ambulatory Visit
Admission: RE | Admit: 2024-07-06 | Discharge: 2024-07-06 | Disposition: A | Discharge status: Still In Hospital | Payer: Self-pay | Source: Ambulatory Visit | Attending: Specialist | Admitting: Specialist

## 2024-07-06 NOTE — PT Treatment (Signed)
 [facility]  Outpatient Physical Therapy  [address]  [place], [address]  [phone]  (Fax) [phone]    Physical Therapy Treatment Note    Date: 07/06/2024  Patient's Name: Justin [name].  Date of Birth: 1975/12/04  Physical Therapy Visit      Visit #/POC: 4 / 12 ( 2x /week x 6 weeks)   Authorization: 4/60 per CY   POC Signed?: NA  POC Ends: 08/06/24  Order Ends: OPEN  Next Progress Note Due: 08/05/24     Evaluating Physical Therapist: SHAWNEE MOORE PT   PT diagnosis/Reason for Referral: LE REHAB S/P SURGICAL STABILIZATION OF CHARCOT FOOT LEFT ( MID FOOT FUSION, ORIF OF NAVICULOCUNEIFORM DISLOCATION)  Next Scheduled Physician Appointment: 08/05/24  Allergies/Contraindications: DM, DIABETIC NEUROPATHY    Subjective: Pt into clinic today using regular straight cane.  States his knees and his L ankle are still quite painful.  States he did wean out of the boot last week.  Rates pain 7/10 today.  Notes he is unsure if pain is weather related or more related to cleaning off his car and cleaning his house over weekend.  States he is doing exercise at home but thinks he may need a smaller ball     Objective:  Activities as noted below.  Did add warm up on Nustep today.      EXERCISE/ACTIVITY NAME REPETITIONS RESISTANCE COMPLETED THIS DOS   Physioball mat exercises             Bridging with knees extended            Bridging with knees flexed            Supine leg curl      2 x 10   2 x 10   2 x 10 na Yes, added to HEP 07/02/24   Sit to stand from 20 inch height  5x   3 sets  na Yes, reviewed as part of HEP 1/22   Standing TKE  40 - 0 DEGREES   X10 X2 BLACK TB  Yes, added to HEP 07/02/24   Standing TKE  resisting perturbation force at knee  Intermittent pull  Black TB YES,  added to HEP 1/22    Calf stretch strap   30 sec x 3    yes, added to HEP 1/21    Nustep  5 min   level 1  yes     forward/retro gait in bars   4 trips each    yes     side step in bars   4 trips     yes                  DISCONTINUED ACTIVITIES                                       Access Code: [email or web address]    HEP Last Updated On: 07/02/24    Assessment:  Pt tolerated treatment well.  He did need encouragement to use cane in clinic.  States he is walking some without it at home.  Advised on importance of normal gait pattern with use of AD to avoid hip, knee, and back pain.      Short-Term Goals: 3 Weeks   -  Patient will demonstrate improved right ankle  AROM to at least 5 degrees or more DF/PF  to improve gait mechanics                -  Patient will demonstrate independence with progressive HEP to maximize gains from physical therapy.                -  Patient will report max 5/10 pain to aid in completion of ADLs/work duties.                -  Patient will demonstrate improved strength of right  LE WFL for sit to stand with more even wt distribution   -  Patient will wean from walking  with minimal to no gait deviation to aid community ambulation.     Long-Term Goals: 6 Weeks                -  Patient will demonstrate improved right  knee strength of at least 4+/5 to aid in functional transfers.                -  Patient will demonstrate improved TUG time of maximum 2  seconds with least restrictive assistive device to demonstrate decreased falls risk.                -   pt will have improved score with timed 5X sit to stand test                -  Patient will demonstrate improved functional ability with Patient Specific Scale Score of at least 7.                -  Patient will demonstrate ability to ambulate up and down stairs with reciprocl  pattern and single rail  use  to aid in community mobility           Plan:  Will continue and progress exercise as tolerated     Total Session Time 35 and Timed code minutes 35  THERAPEUTIC EXERCISE 35 minutes      Letishia Elliott, PTA  07/06/2024, 10:24

## 2024-07-08 ENCOUNTER — Ambulatory Visit (HOSPITAL_COMMUNITY): Payer: Self-pay

## 2024-07-09 ENCOUNTER — Ambulatory Visit
Admission: RE | Admit: 2024-07-09 | Discharge: 2024-07-09 | Disposition: A | Discharge status: Still In Hospital | Payer: Self-pay | Source: Ambulatory Visit | Attending: Specialist | Admitting: Specialist

## 2024-07-09 ENCOUNTER — Other Ambulatory Visit: Payer: Self-pay

## 2024-07-09 NOTE — PT Evaluation (Deleted)
 [facility]  Outpatient Physical Therapy  [address]  [place], [address]  ([phone]  (Fax) [phone]       Physical Therapy Wrist/Hand Evaluation    Date: 07/09/2024  Patient's Name:   Date of Birth:   Physical Therapy Evaluation    Evaluating Physical Therapist:   PT diagnosis/Reason for Referral:   Next Scheduled Physician Appointment:   Allergies/Contraindications:                 SUBJECTIVE  Date of onset:    Cc :   Mechanism of injury:   Previous episodes/treatments:   PLOF:     Past Medical History:   Past Medical History:   Diagnosis Date    Amputated great toe     Atherosclerosis     CPAP (continuous positive airway pressure) dependence     Diabetes mellitus, type 2     HLD (hyperlipidemia)     Hypertension     Intestinal hemorrhage     OCD (obsessive compulsive disorder)     Osgood-Schlatter's disease     Perianal abscess     Sleep apnea     Torsion of epididymis        Past Surgical History:   Past Surgical History:   Procedure Laterality Date    COLONOSCOPY      CYSTECTOMY      HX APPENDECTOMY      LAPOROTOMY    SURGERY SCROTAL / TESTICULAR Right     TOE AMPUTATION Left     great toe       Medications for this problem:   Diagnostic tests:     Patient goals:                                                                                                 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 Open a tight or new jar   [] +1 [] +2 [] +3 [] +4 [] +5   2. Do heavy household chores (e.g., wash walls, floors, etc.)   [] +1 [] +2 [] +3 [] +4 [] +5   3. Carry a shopping bag or briefcase   [] +1 [] +2 [] +3 [] +4 [] +5   4. Wash  your back   [] +1 [] +2 [] +3 [] +4 [] +5   5. Use a knife to cut food   [] +1 [] +2 [] +3 [] +4 [] +5   6. Recreational activities in which you take some force or impact through your arm, shoulder, or  hand (e.g., golf, hammering, tennis, etc.) [] +1 [] +2 [] +3 [] +4 [] +5            Not At All Slightly Moderately Quite A Bit Extremely   7. During the past week, to what extent has your arm, shoulder, or hand problem interfered with your normal social activities with family, friends, neighbors, or groups [] +1 [] +2 [] +3 [] +4 [] +5            Not Limited At All Slightly Limited Moderately Limited Very Limited Unable   8. During the past week, were you limited in your work or other regular daily activities as a result of your arm, shoulder, or hand problem? [] +1 [] +2 [] +3 [] +4 [] +5            None Mild Moderate Severe Extreme   9. In the last week, please rate the severity of arm, shoulder, or hand pain [] +1 [] +2 [] +3 [] +4 [] +5   10. In the last week, please rate the severity of tingling (pins and needles) in your arm, shoulder, or hand. [] +1 [] +2 [] +3 [] +4 [] +5            No Difficulty Mild Difficulty Moderate Difficulty Severe Difficulty Cannot Sleep   11. During the past week, how much difficulty have you had sleeping because of the pain in your arm, shoulder, or hand? [] +1 [] +2 [] +3 [] +4 [] +5     Number of Completed Responses ('n'):   Sum of n Responses (55 points):     QuickDASH Score =  ( [ sum of n responses / n ] - 1 ) x 25, where n is the number of completed responses    Note: A QuickDASH score can not be calculated if there is greater than 1 missing item    QuickDASH Score (100 points):         OBJECTIVE    A/PROM                                                                                                                   A/PROM    Right Left    Right    Left    Index  MCP flexion  Index  MCP extension       Thumb CMC  radial abd  Thumb CMC palmar abd          Index  PIP flexion  Index  PIP extension        Thumb IP  flexion   Thumb IP extension         Index  DIP flexion  Index  DIP extension       Thumb MP flexion  Thumb MP extension         Middle  MCP  flexion  Middle  MCP  extension            Middle  PIP flexion  Middle  PIP  extension       Wrist supination  Wrist pronation  Middle  DIP flexion  Middle  DIP extension       Wrist extension  Wrist  flexion          Ring  MCP flexion  Ring  MCP  extension       Wrist ulnar deviation  Wrist radial deviation         Ring  PIP flexion  Ring  PIP extension            Ring  DIP flexion  Ring  DIP extension            Little  MCP flexion  Little  MCP extension            Little  PIP flexion  Little  PIP extension            Little  DIP flexion  Little  DIP extension               Hand dominance :    []  Right        []  Left   Joint mobility:          []  WNL                                     []  HYPOMOBILE: radiocarpal joint:   []          ulnocarpal joint:  []                radioulnar joint :  []                                                                           MCP joints:  []                   PIP joints:   []                           DIP joints: []     Kapandji score ( opposition  0 -10)) :     Composite finger flexion AROM : (  measured by lag in approximating finger tip to palm)       WNL[]               2nd =    [] cm lag             3rd =    []   cm lag            4th =  []  cm lag             5th =  []  cm lag                                   []   Strength (Manual Muscle Testing per Kendall Muscle Grading system):    WNL    FDP :       []  /5  MMT  FDC:     []   /5 MMT                                         intrinsics:    []  /5 MMT       thumb  IP:  []  /5  MMT             thumb   MP : []   /5 MMT                 finger /thumb extensors:    []  /5 MMT      Wrist  flexors:    []  /5 MMT                                        wrist extensors:   []   /5 MMT     Strength tests:      LEFT    RIGHT        Pain inhibition    JAMAR  grip ( position 2)                     lbs                         lbs          Y/N    Lateral ( key) pinch                      lbs                         lbs          Y/N   Pincher pinch                        lbs                         Lbs            Y/N     Palpation:      Posture:     EDEMA:       Y / N            wrist crease:      cm  increase                                                                       distal palmar crease:     cm increase                  figure 8 circumference :  cm increase    Sensation:   WNL                      Impaired in :       ulnar n. Distribution  median n. Distribution                            radial nerve distribution    SPECIAL TESTS:               Active tendon gliding:         intact                    reduced                            unable     Hypermobility :  Y/N   of     Hypomobility :    Y/N   of                          capsular pattern:  Y/N?      NEUROMUSCULAR :            ULNAR nerve function:   WNL  []              IMPAIRED  []                            +  / -    Froment's sign ( adductor pollicis)           MEDIAN  nerve function ( AIN FUNCTION):   WNL[]        IMPAIRED[]                        + / -      OK sign             RADIAL nerve function:     []  WNL                        [] IMPAIRED      WRIST  ligamentous stability:       WNL []    Ulnar wrist special tests:     Ulnocarpal stress test :             DRUJ ballottement test:         radioulnar shift tests:                                                        ECU synergy test ( tendonopathy):       Lunotriquetral ballottement test:             Derby test:                                                    midcarpal shift test:                                    ulnar snuff box test:      Radial wrist special test:  [employer]   Scaphoidluntate ballottement test:      Scaphoid shift test ( scaphoid  -lunate stability):                                                                       Thumb  Special tests:   []  Negative exam          Adduction test for OA         Retropulsion  test:                                                                                       Finklestein test :                   CMC grind test:                       Treatment provided:  EXERCISE/ACTIVITY NAME REPETITIONS RESISTANCE COMPLETED THIS DOS                       HEP Access Code:           ASSESSMENT    Impression:   Rehab potential:     Short Term Goals: Weeks  - degree increase in A/P ROM of (R/L wrist /1st/2nd/3rd/4th/5th/MCP/PIP/IP)  -AROM  WFL to permit opposition  of 1st digiti to (2nd/3rd/4th/5th/IP) with tip-to-tip/pulp opposition.   -Patient will be Indep in HEP  -Intrinsic muscle length and strength WFL to permit active tendon gliding exercise witho reduced compensatory mechanics within available ROM .   -Intermittent vs. constant pain. Worst SPS rating below    -Increased  pain free grip strength by  lbs     Long Term Goals:  Weeks   -Active opposition WNL  with tip to tip approximation all digits.   -Active composite finger flexion WFL for function grasp.   -Grip strength  not pain inhibited to aid in use of in involved hand with household or work [employer].   -No extension lag with active extension of wrist/digits  -Wrist stabilization strength WFL for resisted use of involved UE without compensatory mechanics or pain.  -Coordination and flexibility of digits WFL for use of involved hand with fine motor ADLS.   -Other:       PLAN  Patient will attend  times per week x  weeks. Therapy may include, but is not limited to     Plan for next visit     Evaluation complexity:   Personal factors impacting POC:    Co-morbidities impacting POC:   Complexity of physical exam:    Clinical Presentation:    Evaluation Complexity:            Intervention minutes:   Shawnee Moore, PT  07/09/2024, 17:14

## 2024-07-09 NOTE — PT Treatment (Signed)
 [facility] Medicine [facility]  Outpatient Physical Therapy  [address]  [place], [address]  [phone]  (Fax) [phone]    Physical Therapy Treatment Note    Date: 07/09/2024  Patient's Name: Justin [name].  Date of Birth: 08/13/1975  Physical Therapy Visit    Visit #/POC: 5 / 12 ( 2x /week x 6 weeks)   Authorization: 4/60 per [place]   POC Signed?: NA  POC Ends: 08/06/24  Order Ends: OPEN  Next Progress Note Due: 08/05/24     Evaluating Physical Therapist: SHAWNEE MOORE PT   PT diagnosis/Reason for Referral: [facility] S/P SURGICAL STABILIZATION OF CHARCOT FOOT LEFT ( MID FOOT FUSION, ORIF OF NAVICULOCUNEIFORM DISLOCATION)  Next Scheduled Physician Appointment: 08/05/24  Allergies/Contraindications: DM, DIABETIC NEUROPATHY     Subjective: he is fully weaned from the boot and he is starting to wean from his cane. His worst SPS rating in past 48 hours = 7 - 8 from prolonged WB . He  using an OTC compression stocking for his swelling  which he notes is effective.  He asks about RTW guidelines. 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 This deformity is much reduced post operatively.      Objective:  observation: the pt has heavy callous formation over mid foot due to abnormal wt. Bearing pattern pre operatively. He has no callous formation over heel and is tender with FWB onto heel. He enters clinic with a stiff gait pattern using a SPC.        ANKLE PROM : 20 degrees  ankle DF  (was 10 degrees  upon SOC)   25 degrees PF (  was 20 degrees )   EXERCISE/ACTIVITY NAME REPETITIONS RESISTANCE COMPLETED THIS DOS   Physioball mat exercises             Bridging with knees extended            Bridging with knees flexed            Supine leg curl      2 x 10   2 x 10   2 x 10 na no, added to HEP 07/02/24   Sit to stand from 20 inch height  5x   3 sets  na no, reviewed as part of HEP 1/22   Standing TKE  40 - 0 DEGREES   X10 X2 BLACK TB  no,  added to HEP 07/02/24   Standing TKE  resisting perturbation force at knee  Intermittent pull  Black TB no  added to HEP 1/22    Calf stretch strap   30 sec x 3    no, added to HEP 1/21    Nustep  5 min   level 1  no    forward/retro gait in bars   4 trips each    no    side step in bars   4 trips     no     talo rural mobilization A-P, P-A  in supine/prone lying   Calcaneal rock   Calcaneal EV/IV prom   GRADE III        Challenged static standing balance  4 way perturbation  Heavy band  Yes, added to HEP 1/29   Talocrural mobilization in FWB with wt. Shifting in staggered stance    Yes, added to HEP 07/09/24      DISCONTINUED ACTIVITIES  Access Code: [email or web address]     HEP Last Updated On: 07/09/24   Access Code: [email or web address]  URL: [email or web address]  Date: 07/09/2024  Prepared by: Shawnee Moore    Exercises  - Stride Stance Weight Shift  - 1 x daily - 7 x weekly - 1 sets - 10 reps  - Standing Balance with Perturbations  - 1-2 x daily - 5-6 x weekly - 1 sets - 6 reps    Assessment:   THE PATIENT HAS MODEST GAINS IN TALOCRURAL MOBILITY  FROM SOC. He WAS VERY CHALLENGED WITH STANDING BALANCE EXERCISE AND EXHIBITS HIP VS ANKLE STRATEGY TO MAINTAIN BALANCE. He REPORTS POOR SENSATION IN FORE FOOT BUT INTACT SENSATION IN His HINDFOOT.  His passive joint play was improved after manual therapies      Short-Term Goals: 3 Weeks   -  Patient will demonstrate improved right ankle  AROM to at least 5 degrees or more DF/PF  to improve gait mechanics ( met 07/09/24)               -  Patient will demonstrate independence with progressive HEP to maximize gains from physical therapy. ( In progress)               -  Patient will report max 5/10 pain to aid in completion of ADLs/work duties.                -  Patient will demonstrate improved strength of right  LE [facility] for sit to stand with more even wt distribution  ( met 07/09/24)   -  Patient will wean from walking boot   with minimal to no gait  deviation  with AD to aid community ambulation. ( In  progress)    Long-Term Goals: 6 Weeks                -  Patient will demonstrate improved right  knee strength of at least 4+/5 to aid in functional transfers.                -  Patient will demonstrate improved TUG time of maximum 2  seconds with least restrictive assistive device to demonstrate decreased falls risk.                -   pt will have improved score with timed 5X sit to stand test                -  Patient will demonstrate improved functional ability with Patient Specific Scale Score of at least 7.                -  Patient will demonstrate ability to ambulate up and down stairs with reciprocl  pattern and single rail  use  to aid in community mobility       Plan: CONTINUE WITH ACTIVE AND PASSIVE EXERCISES TO IMPROVE FUNCTIONAL TALOCRURAL MOBILITY AND TO IMPROVE GAIT MECHANICS. CONTINUE WITH PROPRIOCEPTION EXERCISES     Total Session Time 49 and Timed code minutes 49  THERAPEUTIC EXERCISE 20 minutes and JOINT MOBILIZATION/MFR 29 minutes      Shawnee Moore, PT  07/09/2024, 1330

## 2024-07-09 NOTE — PT Evaluation (Deleted)
 [facility]  Outpatient Physical Therapy  [address]  [place], [address]  ([phone]  (Fax) [phone]       Physical Therapy Wrist/Hand Evaluation    Date: 07/09/2024  Patient's Name:   Date of Birth: Physical Therapy Evaluation    Evaluating Physical Therapist:   PT diagnosis/Reason for Referral:   Next Scheduled Physician Appointment:   Allergies/Contraindications:                 SUBJECTIVE  Date of onset:    Cc :   Mechanism of injury:     Previous episodes/treatments:   PLOF:     Past Medical History:       Past Surgical History:       Medications for this problem:   Diagnostic tests:     Patient goals:                                                                                                 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 Open a tight or new jar   [] +1 [] +2 [] +3 [] +4 [] +5   2. Do heavy household chores (e.g., wash walls, floors, etc.)   [] +1 [] +2 [] +3 [] +4 [] +5   3. Carry a shopping bag or briefcase   [] +1 [] +2 [] +3 [] +4 [] +5   4. Wash your back   [] +1 [] +2 [] +3 [] +4 [] +5   5. Use a knife to cut food   [] +1 [] +2 [] +3 [] +4 [] +5   6. Recreational activities in which you take some force or impact through your arm, shoulder, or hand (e.g., golf, hammering, tennis, etc.) [] +1 [] +2 [] +3 [] +4 [] +5            Not At All Slightly Moderately Quite A Bit Extremely   7. During the past week, to what extent has your arm, shoulder, or hand problem interfered with your normal social activities with family, friends, neighbors, or groups [] +1 [] +2 [] +3 [] +4 [] +5            Not Limited At All Slightly Limited  Moderately Limited Very Limited Unable   8. During the past week, were you limited in your work or other regular daily activities as a result of your arm, shoulder, or hand problem? [] +1 [] +2 [] +3 [] +4 [] +5            None Mild Moderate Severe Extreme   9. In the last week,  please rate the severity of arm, shoulder, or hand pain [] +1 [] +2 [] +3 [] +4 [] +5   10. In the last week, please rate the severity of tingling (pins and needles) in your arm, shoulder, or hand. [] +1 [] +2 [] +3 [] +4 [] +5            No Difficulty Mild Difficulty Moderate Difficulty Severe Difficulty Cannot Sleep   11. During the past week, how much difficulty have you had sleeping because of the pain in your arm, shoulder, or hand? [] +1 [] +2 [] +3 [] +4 [] +5     Number of Completed Responses ('n'):   Sum of n Responses (55 points):     QuickDASH Score =  ( [ sum of n responses / n ] - 1 ) x 25, where n is the number of completed responses    Note: A QuickDASH score can not be calculated if there is greater than 1 missing item  QuickDASH Score (100 points):         OBJECTIVE    A/PROM                                                                                                                   A/PROM    Right Left    Right    Left    Index  MCP flexion  Index  MCP extension       Thumb CMC  radial abd  Thumb CMC palmar abd          Index  PIP flexion  Index  PIP extension        Thumb IP flexion   Thumb IP extension         Index  DIP flexion  Index  DIP extension       Thumb MP flexion  Thumb MP extension         Middle  MCP  flexion  Middle  MCP  extension            Middle  PIP flexion  Middle  PIP  extension       Wrist supination  Wrist pronation         Middle  DIP flexion  Middle  DIP extension       Wrist extension  Wrist  flexion          Ring  MCP flexion  Ring  MCP  extension       Wrist ulnar deviation  Wrist radial deviation         Ring  PIP flexion  Ring  PIP extension            Ring  DIP flexion  Ring  DIP extension             Little  MCP flexion  Little  MCP extension            Little  PIP flexion  Little  PIP extension            Little  DIP flexion  Little  DIP extension               Hand dominance :    []  Right        []  Left   Joint mobility:          []  WNL                                     []  HYPOMOBILE: radiocarpal joint: []          ulnocarpal joint:  []      radioulnar joint :  []                                                                           MCP joints:  []                   PIP joints:   []               DIP joints: []     Kapandji score ( opposition  0 -10)) :     Composite finger flexion AROM : (  measured by lag in approximating finger tip to palm)       WNL[]               2nd =    [] cm lag             3rd =    []   cm lag            4th =  []  cm lag             5th =  []  cm lag                                   []   Strength (Manual Muscle Testing per Kendall Muscle Grading system):    WNL    FDP :       []  /5  MMT                                                                              FDC:     []   /5 MMT                                         intrinsics:    []  /5 MMT       thumb  IP:  []  /5  MMT             thumb   MP : []   /5 MMT                 finger /thumb extensors:    []  /5 MMT  Wrist  flexors:    []  /5 MMT                                        wrist extensors:   []   /5 MMT     Strength tests:      LEFT    RIGHT       Pain inhibition    JAMAR  grip ( position 2)                     lbs                         lbs          Y/N    Lateral ( key) pinch                      lbs                         lbs          Y/N   Pincher pinch                        lbs                         Lbs            Y/N     Palpation:    Posture:   EDEMA:       Y / N            wrist crease:      cm  increase                                                                       distal palmar crease:    cm increase                  figure 8 circumference :  cm increase    Sensation:   WNL  []                        Impaired []  :       ulnar n. Distribution []          median n. Distribution   []       radial nerve distribution []     SPECIAL TESTS:               Active tendon gliding:         intact                    reduced                            unable     Hypermobility :  Y/N   of     Hypomobility :    Y/N   of  capsular pattern:  Y/N?      NEUROMUSCULAR :            ULNAR nerve function:                                       [] WNL         []   IMPAIRED                +  / -    Froment's sign ( adductor pollicis)           MEDIAN  nerve function ( AIN FUNCTION):   [] WNL        []   IMPAIRED                 + / -      OK sign             RADIAL nerve function:                                      [] WNL         [] IMPAIRED      WRIST  ligamentous stability:       WNL []    Ulnar wrist special tests:     Ulnocarpal stress test :             DRUJ ballottement test:         radioulnar shift tests:                                                        ECU synergy test ( tendonopathy):       Lunotriquetral ballottement test:             Derby test:                                                    midcarpal shift test:                                    ulnar snuff box test:      Radial wrist special test:  WNL[]                                                             Scaphoidluntate ballottement test:      Scaphoid shift test ( scaphoid -lunate stability):  Thumb  Special tests:   []  WNL      Adduction test for OA         Retropulsion  test:                                                                     Finklestein test :                   CMC grind test:                       Treatment provided:  EXERCISE/ACTIVITY NAME REPETITIONS RESISTANCE COMPLETED THIS DOS                       HEP Access Code:           ASSESSMENT    Impression:   Rehab potential:     Short Term Goals: Weeks  - degree increase in A/P ROM of (R/L wrist  /1st/2nd/3rd/4th/5th/MCP/PIP/IP)  -AROM  WFL to permit opposition  of 1st digiti to (2nd/3rd/4th/5th/IP) with tip-to-tip/pulp opposition.   -Patient will be Indep in HEP  -Intrinsic muscle length and strength WFL to permit active tendon gliding exercise witho reduced compensatory mechanics within available ROM .   -Intermittent vs. constant pain. Worst SPS rating below    -Increased  pain free grip strength by  lbs     Long Term Goals:  Weeks   -Active opposition WNL  with tip to tip approximation all digits.   -Active composite finger flexion WFL for function grasp.   -Grip strength  not pain inhibited to aid in use of in involved hand with household or work [employer].   -No extension lag with active extension of wrist/digits  -Wrist stabilization strength WFL for resisted use of involved UE without compensatory mechanics or pain.  -Coordination and flexibility of digits WFL for use of involved hand with fine motor ADLS.   -Other:       PLAN  Patient will attend  times per week x  weeks. Therapy may include, but is not limited to     Plan for next visit     Evaluation complexity:   Personal factors impacting POC:    Co-morbidities impacting POC:   Complexity of physical exam:    Clinical Presentation:    Evaluation Complexity:            Intervention minutes:     Shawnee Moore, PT  07/09/2024, 17:28

## 2024-07-11 ENCOUNTER — Encounter (HOSPITAL_BASED_OUTPATIENT_CLINIC_OR_DEPARTMENT_OTHER): Payer: Self-pay | Admitting: Specialist

## 2024-07-13 ENCOUNTER — Ambulatory Visit
Admission: RE | Admit: 2024-07-13 | Discharge: 2024-07-13 | Disposition: A | Discharge status: Still In Hospital | Payer: Self-pay | Source: Ambulatory Visit | Attending: Specialist | Admitting: Specialist

## 2024-07-15 ENCOUNTER — Ambulatory Visit
Admission: RE | Admit: 2024-07-15 | Discharge: 2024-07-15 | Disposition: A | Discharge status: Still In Hospital | Payer: Self-pay | Source: Ambulatory Visit | Attending: Specialist | Admitting: Specialist

## 2024-07-15 ENCOUNTER — Telehealth (HOSPITAL_BASED_OUTPATIENT_CLINIC_OR_DEPARTMENT_OTHER): Payer: Self-pay | Admitting: Specialist

## 2024-07-15 ENCOUNTER — Other Ambulatory Visit: Payer: Self-pay

## 2024-07-15 NOTE — PT Treatment (Signed)
 [facility]  Outpatient Physical Therapy  [address]  [place], [address]  [phone]  (Fax) [phone]    Physical Therapy Treatment Note    Date: 07/15/2024  Patient's Name: Justin [name].  Date of Birth: [birth year]  Physical Therapy Visit      Visit #/POC: 6 / 12 ( 2x /week x 6 weeks)   Authorization: 6/60 per [place]   POC Signed?: NA  POC Ends: 08/06/24  Order Ends: OPEN  Next Progress Note Due: 08/05/24     Evaluating Physical Therapist: SHAWNEE MOORE PT   PT diagnosis/Reason for Referral: [facility] S/P SURGICAL STABILIZATION OF CHARCOT FOOT LEFT ( MID FOOT FUSION, ORIF OF NAVICULOCUNEIFORM DISLOCATION)  Next Scheduled Physician Appointment: 08/05/24  Allergies/Contraindications: DM, DIABETIC NEUROPATHY    Subjective:  Pt notes that he is wearing a compression stocking today so he believes this has helped with the swelling today as it is not as swollen. He also notes that he is awaiting a call from the surgeon to see if he will ok him to go back to work. He does feel like he has a little more mobility in his foot than when he started and notes that standing to cook and shower is getting some better.     Objective: Activities as noted below.   Retested TUG and 5x STS today as well.   TUG: 12.54  5X STS 21.35    Problem Score 07/15/24   1. Grocery shopping 5 7   2. Standing to cook 5 8   3. Stand in the shower 5 8   Total score = sum of the activity scores/number of activities    Minimal detectable change (90% CI) for avg score = 2 points    Minimal detectable change (90% CI) for single activity score = 3 points  5 7.6         ANKLE PROM : 20 degrees  ankle DF  (was 10 degrees  upon SOC)   25 degrees PF (  was 20 degrees )   EXERCISE/ACTIVITY NAME REPETITIONS RESISTANCE COMPLETED THIS DOS   Physioball mat exercises             Bridging with knees extended            Bridging with knees flexed            Supine leg curl      2 x 10   2 x 10   2 x 10 na no, added to HEP 07/02/24    Sit to stand from 20 inch height  5x   3 sets  na no, reviewed as part of HEP 1/22   Standing TKE  40 - 0 DEGREES   X10 X2 BLACK TB  no, added to HEP 07/02/24   Standing TKE  resisting perturbation force at knee  Intermittent pull  Black TB no  added to HEP 1/22    Calf stretch strap   30 sec x 3    no, added to HEP 1/21    Nustep  5 min   level 1  y     forward/retro gait in bars   4 trips each    no    side step in bars   4 trips     no     talo rural mobilization A-P, P-A  in supine/prone lying   Calcaneal rock   Calcaneal EV/IV prom      Retrograde  massage foot and ankle   GRADE III     y      Y    Y      Y    Challenged static standing balance  4 way perturbation  Heavy band  Yes, added to HEP 1/29   Talocrural mobilization in FWB with wt. Shifting in staggered stance      Yes, added to HEP 07/09/24      DISCONTINUED ACTIVITIES                                       Access Code: [email or web address]     HEP Last Updated On: 07/09/24, HEP was updated last week (08/04/24)      Assessment:  Pt has demonstrated improvement with therapy up to this point. His 5x STS and TUG have both increased as well as his PSFS. Pt is challenged with dynamic balance activities.     Short-Term Goals: 3 Weeks   -  Patient will demonstrate improved right ankle  AROM to at least 5 degrees or more DF/PF  to improve gait mechanics ( met 07/09/24)               -  Patient will demonstrate independence with progressive HEP to maximize gains from physical therapy. ( In progress)               -  Patient will report max 5/10 pain to aid in completion of ADLs/work duties.                -  Patient will demonstrate improved strength of right  LE [facility] for sit to stand with more even wt distribution  ( met 07/09/24)   -  Patient will wean from walking boot   with minimal to no gait deviation  with AD to aid community ambulation. ( In  progress)    Long-Term Goals: 6 Weeks                -  Patient will demonstrate improved right  knee strength of at least 4+/5 to  aid in functional transfers.                -  Patient will demonstrate improved TUG time of maximum 2  seconds with least restrictive assistive device to demonstrate decreased falls risk.  (Improved from 18 to 12 seconds)               -   pt will have improved score with timed 5X sit to stand test (improved however still indicates high fall risk)               -  Patient will demonstrate improved functional ability with Patient Specific Scale Score of at least 7.  (Met 07/15/24)               -  Patient will demonstrate ability to ambulate up and down stairs with reciprocl  pattern and single rail  use  to aid in community mobility    Plan:  Will continue and progress as tolerated. Will monitor effects of massage    Total Session Time 40 and Timed code minutes 40  THERAPEUTIC EXERCISE 30 minutes and JOINT MOBILIZATION/MFR 10 minutes    Jillienne Egner, PTA  07/15/2024 17:28

## 2024-07-20 ENCOUNTER — Ambulatory Visit (HOSPITAL_COMMUNITY): Payer: Self-pay

## 2024-07-22 ENCOUNTER — Ambulatory Visit (HOSPITAL_COMMUNITY): Payer: Self-pay

## 2024-07-27 ENCOUNTER — Ambulatory Visit (HOSPITAL_COMMUNITY): Payer: Self-pay

## 2024-07-29 ENCOUNTER — Ambulatory Visit (HOSPITAL_COMMUNITY): Payer: Self-pay

## 2024-08-03 ENCOUNTER — Ambulatory Visit (HOSPITAL_COMMUNITY): Payer: Self-pay

## 2024-08-05 ENCOUNTER — Ambulatory Visit (HOSPITAL_COMMUNITY): Payer: Self-pay

## 2024-08-05 ENCOUNTER — Ambulatory Visit (HOSPITAL_BASED_OUTPATIENT_CLINIC_OR_DEPARTMENT_OTHER): Payer: Self-pay | Admitting: Specialist

## 2024-08-06 ENCOUNTER — Ambulatory Visit (HOSPITAL_COMMUNITY)
